# Patient Record
Sex: Male | Born: 1970 | Race: Black or African American | Hispanic: No | Marital: Single | Smoking: Current some day smoker
Health system: Southern US, Community
[De-identification: ages and names within clinical notes are randomized; demographics above are authoritative.]

## PROBLEM LIST (undated history)

## (undated) DIAGNOSIS — B20 Human immunodeficiency virus [HIV] disease: Secondary | ICD-10-CM

## (undated) DIAGNOSIS — Z21 Asymptomatic human immunodeficiency virus [HIV] infection status: Secondary | ICD-10-CM

## (undated) DIAGNOSIS — K219 Gastro-esophageal reflux disease without esophagitis: Secondary | ICD-10-CM

## (undated) HISTORY — DX: Gastro-esophageal reflux disease without esophagitis: K21.9

## (undated) HISTORY — PX: HERNIA REPAIR: SHX51

---

## 1998-06-26 ENCOUNTER — Emergency Department (HOSPITAL_COMMUNITY): Admission: EM | Admit: 1998-06-26 | Discharge: 1998-06-26 | Payer: Self-pay | Admitting: Emergency Medicine

## 2001-02-25 ENCOUNTER — Encounter (INDEPENDENT_AMBULATORY_CARE_PROVIDER_SITE_OTHER): Payer: Self-pay | Admitting: *Deleted

## 2001-02-25 LAB — CONVERTED CEMR LAB: CD4 Count: 192 microliters

## 2001-04-02 ENCOUNTER — Ambulatory Visit (HOSPITAL_COMMUNITY): Admission: RE | Admit: 2001-04-02 | Discharge: 2001-04-02 | Payer: Self-pay | Admitting: Infectious Diseases

## 2001-04-02 ENCOUNTER — Encounter: Admission: RE | Admit: 2001-04-02 | Discharge: 2001-04-02 | Payer: Self-pay | Admitting: Infectious Diseases

## 2001-04-22 ENCOUNTER — Ambulatory Visit (HOSPITAL_COMMUNITY): Admission: RE | Admit: 2001-04-22 | Discharge: 2001-04-22 | Payer: Self-pay | Admitting: Infectious Diseases

## 2001-04-22 ENCOUNTER — Encounter: Admission: RE | Admit: 2001-04-22 | Discharge: 2001-04-22 | Payer: Self-pay | Admitting: Infectious Diseases

## 2001-05-06 ENCOUNTER — Encounter: Admission: RE | Admit: 2001-05-06 | Discharge: 2001-05-06 | Payer: Self-pay | Admitting: Infectious Diseases

## 2001-06-17 ENCOUNTER — Encounter: Admission: RE | Admit: 2001-06-17 | Discharge: 2001-06-17 | Payer: Self-pay | Admitting: Infectious Diseases

## 2001-06-17 ENCOUNTER — Ambulatory Visit (HOSPITAL_COMMUNITY): Admission: RE | Admit: 2001-06-17 | Discharge: 2001-06-17 | Payer: Self-pay | Admitting: Infectious Diseases

## 2001-07-01 ENCOUNTER — Encounter: Admission: RE | Admit: 2001-07-01 | Discharge: 2001-07-01 | Payer: Self-pay | Admitting: Infectious Diseases

## 2002-01-27 ENCOUNTER — Encounter: Admission: RE | Admit: 2002-01-27 | Discharge: 2002-01-27 | Payer: Self-pay | Admitting: Infectious Diseases

## 2002-01-27 ENCOUNTER — Ambulatory Visit (HOSPITAL_COMMUNITY): Admission: RE | Admit: 2002-01-27 | Discharge: 2002-01-27 | Payer: Self-pay | Admitting: Infectious Diseases

## 2002-12-06 ENCOUNTER — Emergency Department (HOSPITAL_COMMUNITY): Admission: EM | Admit: 2002-12-06 | Discharge: 2002-12-06 | Payer: Self-pay | Admitting: Emergency Medicine

## 2003-03-15 ENCOUNTER — Encounter: Payer: Self-pay | Admitting: Infectious Diseases

## 2003-03-15 ENCOUNTER — Encounter: Admission: RE | Admit: 2003-03-15 | Discharge: 2003-03-15 | Payer: Self-pay | Admitting: Infectious Diseases

## 2003-03-15 ENCOUNTER — Encounter (INDEPENDENT_AMBULATORY_CARE_PROVIDER_SITE_OTHER): Payer: Self-pay | Admitting: *Deleted

## 2003-03-15 LAB — CONVERTED CEMR LAB: CD4 Count: 260 microliters

## 2006-08-25 ENCOUNTER — Ambulatory Visit: Payer: Self-pay | Admitting: Infectious Diseases

## 2006-08-25 ENCOUNTER — Encounter (INDEPENDENT_AMBULATORY_CARE_PROVIDER_SITE_OTHER): Payer: Self-pay | Admitting: *Deleted

## 2006-08-25 ENCOUNTER — Encounter: Admission: RE | Admit: 2006-08-25 | Discharge: 2006-08-25 | Payer: Self-pay | Admitting: Infectious Diseases

## 2006-08-25 LAB — CONVERTED CEMR LAB
CD4 Count: 160 microliters
HIV 1 RNA Quant: 41100 copies/mL

## 2006-09-08 ENCOUNTER — Ambulatory Visit: Payer: Self-pay | Admitting: Infectious Diseases

## 2006-10-08 ENCOUNTER — Encounter: Admission: RE | Admit: 2006-10-08 | Discharge: 2006-10-08 | Payer: Self-pay | Admitting: Infectious Diseases

## 2006-10-08 ENCOUNTER — Ambulatory Visit: Payer: Self-pay | Admitting: Infectious Diseases

## 2006-10-08 ENCOUNTER — Encounter (INDEPENDENT_AMBULATORY_CARE_PROVIDER_SITE_OTHER): Payer: Self-pay | Admitting: *Deleted

## 2006-10-08 LAB — CONVERTED CEMR LAB
Alkaline Phosphatase: 70 units/L (ref 39–117)
BUN: 11 mg/dL (ref 6–23)
CO2: 23 meq/L (ref 19–32)
Chloride: 106 meq/L (ref 96–112)
Creatinine, Ser: 1.12 mg/dL (ref 0.40–1.50)
Glucose, Bld: 107 mg/dL — ABNORMAL HIGH (ref 70–99)
MCHC: 34.5 g/dL (ref 33.1–35.4)
MCV: 90.5 fL (ref 78.8–100.0)
Potassium: 4.1 meq/L (ref 3.5–5.3)
RBC: 4.55 M/uL (ref 4.20–5.50)
Total Bilirubin: 0.4 mg/dL (ref 0.3–1.2)
Total Protein: 8.7 g/dL — ABNORMAL HIGH (ref 6.0–8.3)
WBC: 3.5 10*3/uL — ABNORMAL LOW (ref 3.7–10.0)

## 2007-01-01 DIAGNOSIS — B20 Human immunodeficiency virus [HIV] disease: Secondary | ICD-10-CM | POA: Insufficient documentation

## 2007-01-01 DIAGNOSIS — F17200 Nicotine dependence, unspecified, uncomplicated: Secondary | ICD-10-CM

## 2007-01-19 ENCOUNTER — Encounter (INDEPENDENT_AMBULATORY_CARE_PROVIDER_SITE_OTHER): Payer: Self-pay | Admitting: *Deleted

## 2007-01-19 LAB — CONVERTED CEMR LAB

## 2007-01-23 ENCOUNTER — Encounter: Payer: Self-pay | Admitting: Infectious Diseases

## 2007-02-01 ENCOUNTER — Encounter (INDEPENDENT_AMBULATORY_CARE_PROVIDER_SITE_OTHER): Payer: Self-pay | Admitting: *Deleted

## 2007-02-03 ENCOUNTER — Telehealth: Payer: Self-pay | Admitting: Infectious Diseases

## 2007-03-16 ENCOUNTER — Encounter: Admission: RE | Admit: 2007-03-16 | Discharge: 2007-03-16 | Payer: Self-pay | Admitting: Infectious Diseases

## 2007-03-16 ENCOUNTER — Ambulatory Visit: Payer: Self-pay | Admitting: Infectious Diseases

## 2007-04-01 ENCOUNTER — Ambulatory Visit: Payer: Self-pay | Admitting: Infectious Diseases

## 2007-04-01 DIAGNOSIS — R809 Proteinuria, unspecified: Secondary | ICD-10-CM | POA: Insufficient documentation

## 2007-11-24 ENCOUNTER — Telehealth: Payer: Self-pay | Admitting: Infectious Diseases

## 2007-12-29 ENCOUNTER — Encounter: Admission: RE | Admit: 2007-12-29 | Discharge: 2007-12-29 | Payer: Self-pay | Admitting: Infectious Diseases

## 2007-12-29 ENCOUNTER — Ambulatory Visit: Payer: Self-pay | Admitting: Infectious Diseases

## 2007-12-29 LAB — CONVERTED CEMR LAB
ALT: 78 units/L — ABNORMAL HIGH (ref 0–53)
Albumin: 4.5 g/dL (ref 3.5–5.2)
BUN: 14 mg/dL (ref 6–23)
Cholesterol: 114 mg/dL (ref 0–200)
Creatinine, Ser: 1.06 mg/dL (ref 0.40–1.50)
HDL: 32 mg/dL — ABNORMAL LOW (ref >39)
HIV 1 RNA Quant: 158000 copies/mL — ABNORMAL HIGH (ref <50)
Hemoglobin: 13.9 g/dL (ref 13.0–17.0)
Hep A Total Ab: NEGATIVE
LDL Cholesterol: 68 mg/dL (ref 0–99)
MCHC: 33 g/dL (ref 30.0–36.0)
MCV: 94 fL (ref 78.0–100.0)
Potassium: 4.1 meq/L (ref 3.5–5.3)
RBC: 4.48 M/uL (ref 4.22–5.81)
Total Bilirubin: 1 mg/dL (ref 0.3–1.2)
Total CHOL/HDL Ratio: 3.6
Triglycerides: 70 mg/dL (ref <150)
VLDL: 14 mg/dL (ref 0–40)
WBC: 2.5 10*3/uL — ABNORMAL LOW (ref 4.0–10.5)

## 2008-01-07 ENCOUNTER — Encounter (INDEPENDENT_AMBULATORY_CARE_PROVIDER_SITE_OTHER): Payer: Self-pay | Admitting: *Deleted

## 2008-01-11 ENCOUNTER — Ambulatory Visit: Payer: Self-pay | Admitting: Infectious Diseases

## 2009-10-24 ENCOUNTER — Telehealth (INDEPENDENT_AMBULATORY_CARE_PROVIDER_SITE_OTHER): Payer: Self-pay | Admitting: *Deleted

## 2009-12-26 ENCOUNTER — Telehealth: Payer: Self-pay

## 2009-12-29 ENCOUNTER — Ambulatory Visit: Payer: Self-pay | Admitting: Internal Medicine

## 2009-12-29 DIAGNOSIS — K5900 Constipation, unspecified: Secondary | ICD-10-CM | POA: Insufficient documentation

## 2009-12-29 DIAGNOSIS — R1110 Vomiting, unspecified: Secondary | ICD-10-CM

## 2009-12-29 DIAGNOSIS — K219 Gastro-esophageal reflux disease without esophagitis: Secondary | ICD-10-CM | POA: Insufficient documentation

## 2009-12-29 LAB — CONVERTED CEMR LAB
AST: 30 units/L (ref 0–37)
Albumin: 4.6 g/dL (ref 3.5–5.2)
CO2: 25 meq/L (ref 19–32)
Calcium: 9.5 mg/dL (ref 8.4–10.5)
Chloride: 106 meq/L (ref 96–112)
Eosinophils Relative: 7 % — ABNORMAL HIGH (ref 0–5)
Glucose, Bld: 98 mg/dL (ref 70–99)
HIV 1 RNA Quant: 51 copies/mL — ABNORMAL HIGH (ref <48)
Hemoglobin: 15.3 g/dL (ref 13.0–17.0)
Lymphocytes Relative: 31 % (ref 12–46)
MCHC: 34.3 g/dL (ref 30.0–36.0)
Neutro Abs: 2.2 10*3/uL (ref 1.7–7.7)
Neutrophils Relative %: 50 % (ref 43–77)
Platelets: 226 10*3/uL (ref 150–400)
Potassium: 4 meq/L (ref 3.5–5.3)
RDW: 12.1 % (ref 11.5–15.5)
Sodium: 140 meq/L (ref 135–145)
Total Bilirubin: 0.4 mg/dL (ref 0.3–1.2)
WBC: 4.5 10*3/uL (ref 4.0–10.5)

## 2010-01-05 ENCOUNTER — Ambulatory Visit (HOSPITAL_COMMUNITY): Admission: RE | Admit: 2010-01-05 | Discharge: 2010-01-05 | Payer: Self-pay | Admitting: Internal Medicine

## 2010-01-05 ENCOUNTER — Telehealth: Payer: Self-pay | Admitting: Infectious Diseases

## 2010-01-23 ENCOUNTER — Ambulatory Visit: Payer: Self-pay | Admitting: Infectious Diseases

## 2010-01-23 DIAGNOSIS — L0293 Carbuncle, unspecified: Secondary | ICD-10-CM

## 2010-01-23 DIAGNOSIS — L0292 Furuncle, unspecified: Secondary | ICD-10-CM

## 2010-01-31 ENCOUNTER — Emergency Department (HOSPITAL_COMMUNITY): Admission: EM | Admit: 2010-01-31 | Discharge: 2010-02-01 | Payer: Self-pay | Admitting: Emergency Medicine

## 2010-02-01 ENCOUNTER — Telehealth (INDEPENDENT_AMBULATORY_CARE_PROVIDER_SITE_OTHER): Payer: Self-pay | Admitting: *Deleted

## 2010-02-02 ENCOUNTER — Ambulatory Visit: Payer: Self-pay | Admitting: Internal Medicine

## 2010-02-28 ENCOUNTER — Telehealth: Payer: Self-pay | Admitting: Infectious Diseases

## 2010-12-23 LAB — CONVERTED CEMR LAB
Alkaline Phosphatase: 102 units/L (ref 39–117)
BUN: 12 mg/dL (ref 6–23)
Basophils Absolute: 0 10*3/uL (ref 0.0–0.1)
CD4 Count: 200 microliters
Chloride: 110 meq/L (ref 96–112)
Creatinine, Ser: 1.18 mg/dL (ref 0.40–1.50)
Eosinophils Absolute: 0.2 10*3/uL (ref 0.0–0.7)
Glucose, Bld: 110 mg/dL — ABNORMAL HIGH (ref 70–99)
HIV 1 RNA Quant: 241 copies/mL — ABNORMAL HIGH (ref <50)
Lymphocytes Relative: 34 % (ref 12–46)
MCHC: 32.4 g/dL (ref 30.0–36.0)
MCV: 98.9 fL (ref 78.0–100.0)
Neutro Abs: 1.8 10*3/uL (ref 1.7–7.7)
Neutrophils Relative %: 47 % (ref 43–77)
Potassium: 4.3 meq/L (ref 3.5–5.3)
RDW: 11.9 % (ref 11.5–14.0)
Sodium: 141 meq/L (ref 135–145)
Total Bilirubin: 0.5 mg/dL (ref 0.3–1.2)

## 2010-12-27 NOTE — Assessment & Plan Note (Signed)
Summary: Elijah Guerra   CC:  follow-up visit / knuckles on both hands have been hurting.  History of Present Illness: 40 yo  40 yo m with HIV+ diagnosed 2002  and was lost to f/u 2003 to 2007. He was started on atripla 10-07.   Had CD4 310 and VL 51 (12-29-2009). Has been doing well. needs refills on meds. No missed meds. Seen by MD last month for n/v. Had nl upper gi, started on PPI. Has improved since.   Preventive Screening-Counseling & Management  Alcohol-Tobacco     Alcohol drinks/day: 0     Smoking Status: current     Smoking Cessation Counseling: yes     Cigars/week: 4 per week  Caffeine-Diet-Exercise     Caffeine use/day: tea, coffee occassionally     Does Patient Exercise: yes     Type of exercise: gym membership     Exercise (avg: min/session): >60     Times/week: 3  Safety-Violence-Falls     Seat Belt Use: yes   Updated Prior Medication List: ATRIPLA 600-200-300 MG TABS (EFAVIRENZ-EMTRICITAB-TENOFOVIR) Take 1 tablet by mouth at bedtime PRILOSEC 20 MG CPDR (OMEPRAZOLE) Take 1 tablet by mouth once a day  Current Allergies (reviewed today): No known allergies  Past History:  Past medical, surgical, family and social histories (including risk factors) reviewed, and no changes noted (except as noted below).  Past Medical History: HIV disease Cigarette smoking GERD  Current Medications (verified): 1)  Atripla 600-200-300 Mg Tabs (Efavirenz-Emtricitab-Tenofovir) .... Take 1 Tablet By Mouth At Bedtime 2)  Prilosec 20 Mg Cpdr (Omeprazole) .... Take 1 Tablet By Mouth Once A Day  Allergies (verified): No Known Drug Allergies   Family History: Reviewed history and no changes required.  Social History: Reviewed history and no changes required.  Review of Systems       wt steady.   Vital Signs:  Patient profile:   40 year old male Height:      66 inches (167.64 cm) Weight:      178.6 pounds (81.18 kg) BMI:     28.93 Temp:     97.9 degrees F (36.61 degrees C)  oral Pulse rate:   92 / minute BP sitting:   112 / 71  (right arm)  Vitals Entered By: Baxter Hire) (January 23, 2010 3:27 PM) CC: follow-up visit / knuckles on both hands have been hurting Pain Assessment Patient in pain? yes     Location: knuckles Intensity: 4 Type: aching Onset of pain  pain happens when overuse of hands Nutritional Status BMI of 25 - 29 = overweight Nutritional Status Detail appetite is fine per patient  Does patient need assistance? Functional Status Self care Ambulation Normal        Medication Adherence: 01/23/2010   Adherence to medications reviewed with patient. Counseling to provide adequate adherence provided   Prevention For Positives: 01/23/2010   Safe sex practices discussed with patient. Condoms offered.                             Physical Exam  General:  well-developed, well-nourished, and well-hydrated.   Eyes:  pupils equal, pupils round, and pupils reactive to light.   Mouth:  pharynx pink and moist and no exudates.   Neck:  no masses.   Lungs:  normal respiratory effort and normal breath sounds.   Heart:  normal rate, regular rhythm, and no murmur.   Abdomen:  soft, non-tender, and  normal bowel sounds.   Skin:  healing furuncle on R mid/lower abd. nonflucuant. non-tender. no increase heat, no erythema.    Impression & Recommendations:  Problem # 1:  HIV DISEASE (ICD-042) he is doing well, offered condoms. taking meds well. return to clinic 6 months with labs prior His updated medication list for this problem includes:    Bactrim Ds 800-160 Mg Tabs (Sulfamethoxazole-trimethoprim) .Marland Kitchen... Take 1 tablet by mouth two times a day  Problem # 2:  GERD (ICD-530.81) improved. cont ppi.  His updated medication list for this problem includes:    Prilosec 20 Mg Cpdr (Omeprazole) .Marland Kitchen... Take 1 tablet by mouth once a day  Problem # 3:  BOILS, RECURRENT (ICD-680.9) suspect he has MRSA skin infections. will give him trial of  bactrim to see if we can clear this up.   Medications Added to Medication List This Visit: 1)  Bactrim Ds 800-160 Mg Tabs (Sulfamethoxazole-trimethoprim) .... Take 1 tablet by mouth two times a day  Other Orders: Est. Patient Level IV ([address]) Future Orders: T-CD4SP ([facility]) (CD4SP) ... 07/22/2010 T-HIV Viral Load [id number]) ... 07/22/2010 T-Comprehensive Metabolic Panel [phone]) ... 07/22/2010 T-CBC w/Diff ([id number]) ... 07/22/2010 T-Lipid Profile [id number]) ... 07/22/2010 T-RPR (Syphilis) [id number]) ... 07/22/2010  Prescriptions: PRILOSEC 20 MG CPDR (OMEPRAZOLE) Take 1 tablet by mouth once a day  #90 x 3   Entered and Authorized by:   Johny Sax MD   Signed by:   Johny Sax MD on 01/23/2010   Method used:   Electronically to        [facility] [address] #[id number]* (retail)       [address] [place], [place]  [address]       Ph: [phone]       Fax: [phone]   RxID:   [id number] ATRIPLA 600-200-300 MG TABS (EFAVIRENZ-EMTRICITAB-TENOFOVIR) Take 1 tablet by mouth at bedtime  #90 x 3   Entered and Authorized by:   Johny Sax MD   Signed by:   Johny Sax MD on 01/23/2010   Method used:   Electronically to        [facility] [address] #[id number]* (retail)       [address] [place], [place]  [address]       Ph: [phone]       Fax: [phone]   RxID:   [id number] BACTRIM DS 800-160 MG TABS (SULFAMETHOXAZOLE-TRIMETHOPRIM) Take 1 tablet by mouth two times a day  #28 x 1   Entered and Authorized by:   Johny Sax MD   Signed by:   Johny Sax MD on 01/23/2010   Method used:   Electronically to        [facility] [address] #[id number]* (retail)       [address] [place], [place]  [address]       Ph: [phone]       Fax: [phone]   RxID:   [id number]  Process Orders Check Orders Results:     [facility]: ABN not required for this insurance Tests Sent for requisitioning  (January 23, 2010 4:11 PM):     07/22/2010: [facility] -- T-HIV Viral Load [id number] (signed)     07/22/2010: [facility] -- T-Comprehensive Metabolic Panel [[id number]] (signed)     07/22/2010: [facility] -- T-CBC w/Diff [[id number]] (signed)     07/22/2010: [facility] -- T-Lipid Profile [phone] (signed)     07/22/2010: [facility] -- T-RPR (Syphilis) [phone] (signed)

## 2010-12-27 NOTE — Progress Notes (Signed)
Summary: rx for blisters not working  Phone Note Call from Patient Call back at [facility] [phone]   Caller: Patient Reason for Call: Acute Illness Action Taken: Phone Call Completed, Appt Scheduled Summary of Call: pt c/o bad outbreak of blisters on testicles.  Was given medication by Dr. Daiva Eves, but this is not helping and outbreak has gotten worse since last visit.requesting appt. Wendall Mola CMA Duncan Dull)  February 01, 2010 11:24 AM

## 2010-12-27 NOTE — Assessment & Plan Note (Signed)
Summary: EST/CK/CFB   Chief Complaint:  f/u HIV disease.  History of Present Illness: 40 yo m with HIV+ diagnosed 2002  and was lost to f/u 2003 to 2007. He was started on atripla 10-07.  Most recent CD4 160 and VL 158k (12-29-07).  taknig meds, was off in dec. rx ran out. restarted 3 weeks.  eating well, wt up, moving bowels well,                Updated Prior Medication List: ATRIPLA 600-200-300 MG TABS (EFAVIRENZ-EMTRICITAB-TENOFOVIR) Take 1 tablet by mouth at bedtime  Current Allergies (reviewed today): No known allergies     Risk Factors:  Tobacco use:  current    Cigarettes:  Yes -- 1ppd pack(s) per day    Counseled to quit/cut down tobacco use:  yes    Vital Signs:  Patient Profile:   40 Years Old Male Height:     66 inches (167.64 cm) Weight:      183.4 pounds (83.36 kg) BMI:     29.71 Temp:     97.2 degrees F (36.22 degrees C) oral Pulse rate:   75 / minute BP sitting:   112 / 76  (left arm)  Pt. in pain?   no  Vitals Entered By: Krystal Eaton Duncan Dull) (January 11, 2008 10:52 AM)              Is Patient Diabetic? No Nutritional Status BMI of 25 - 29 = overweight  Have you ever been in a relationship where you felt threatened, hurt or afraid?No   Does patient need assistance? Functional Status Self care Ambulation Normal     Physical Exam  General:     well-developed, well-nourished, and well-hydrated.   Eyes:     pupils equal, pupils round, and pupils reactive to light.   Mouth:     pharynx pink and moist and no exudates.   Neck:     no masses.   Lungs:     normal respiratory effort, no intercostal retractions, and no accessory muscle use.   Heart:     normal rate, regular rhythm, and no murmur.   Abdomen:     soft, non-tender, and normal bowel sounds.      Impression & Recommendations:  Problem # 1:  HIV DISEASE (ICD-042) hard to interpret  his labs in light of his recent time off medicines. I reiterated to him the need to  stay on his medicines and risk of developing resistance.  he is offered condoms will see him bac in 3 months when has been on medicines and recheck his CD4, Vl, genotype, hep A antibody.  His updated medication list for this problem includes:    Atripla 600-200-300 Mg Tabs (Efavirenz-emtricitab-tenofovir) .Marland Kitchen... Take 1 tablet by mouth at bedtime  Orders: Influenza Vaccine NON MCR (84132) Hepatitis B Vaccine >30yrs (44010) Admin 1st Vaccine (27253) Est. Patient Level III (66440)      ]     Hepatitis B Vaccine # 3    Vaccine Type: HepB Adult    Site: left deltoid    Mfr: GlaxoSmithKline    Dose: 1.0 ml    Route: IM    Given by: Krystal Eaton (AAMA)    Exp. Date: 09/30/2009    Lot #: [id number]    VIS given: 06/04/00 version given January 11, 2008.  Influenza Vaccine    Vaccine Type: Fluvax Non-MCR    Site: right deltoid    Mfr: Sanofi Pasteur    Dose: 0.5  ml    Route: IM    Given by: Krystal Eaton (AAMA)    Exp. Date: 05/24/2008    Lot #: [id number]    VIS given: 06/18/07 version given January 11, 2008.  Flu Vaccine Consent Questions    Do you have a history of severe allergic reactions to this vaccine? no    Any prior history of allergic reactions to egg and/or gelatin? no    Do you have a sensitivity to the preservative Thimersol? no    Do you have a past history of Guillan-Barre Syndrome? no    Do you currently have an acute febrile illness? no    Have you ever had a severe reaction to latex? no    Vaccine information given and explained to patient? yes

## 2010-12-27 NOTE — Assessment & Plan Note (Signed)
Summary: blisters/jc   CC:  blisters on testicles x 2 weeks prescribed bactrim and not seeming to work.  History of Present Illness: Pt states that the boils on his scrotum became larger and more painful so he went to the ED. They opened one of the areas and drained some pus and switched hm to Doxycycline and gave him some percocet.  he is still in pain but the swelling has gone down.  He is concerned that it is still draining.  Preventive Screening-Counseling & Management  Alcohol-Tobacco     Alcohol drinks/day: 0     Smoking Status: current     Smoking Cessation Counseling: yes     Packs/Day: <0.25     Cigars/week: 4 per week  Caffeine-Diet-Exercise     Caffeine use/day: tea, coffee occassionally     Does Patient Exercise: yes     Type of exercise: gym membership     Exercise (avg: min/session): >60     Times/week: 3   Updated Prior Medication List: ATRIPLA 600-200-300 MG TABS (EFAVIRENZ-EMTRICITAB-TENOFOVIR) Take 1 tablet by mouth at bedtime PRILOSEC 20 MG CPDR (OMEPRAZOLE) Take 1 tablet by mouth once a day BACTRIM DS 800-160 MG TABS (SULFAMETHOXAZOLE-TRIMETHOPRIM) Take 1 tablet by mouth two times a day DOXYCYCLINE HYCLATE 100 MG CAPS (DOXYCYCLINE HYCLATE) take one capsule by mouth twice daily x 14 days PERCOCET 10-325 MG TABS (OXYCODONE-ACETAMINOPHEN) Take 1 tablet by mouth every 6 hours as needed  Current Allergies (reviewed today): No known allergies  Past History:  Past Medical History: Last updated: 01/23/2010 HIV disease Cigarette smoking GERD  Review of Systems  The patient denies fever and abdominal pain.    Vital Signs:  Patient profile:   40 year old male Height:      66 inches (167.64 cm) Weight:      176.8 pounds (80.36 kg) BMI:     28.64 Temp:     98.0 degrees F (36.67 degrees C) oral Pulse rate:   83 / minute BP sitting:   108 / 68  (left arm)  Vitals Entered By: Wendall Mola CMA Duncan Dull) (February 02, 2010 10:12 AM) CC: blisters on  testicles x 2 weeks prescribed bactrim and not seeming to work Pain Assessment Patient in pain? yes     Location: groin Intensity: 7 Type: stinging and pressure Nutritional Status BMI of 25 - 29 = overweight Nutritional Status Detail appetite "good"  Does patient need assistance? Functional Status Self care Ambulation Normal Comments no missed doses of meds per patient   Physical Exam  General:  alert, well-developed, well-nourished, and well-hydrated.   Head:  normocephalic and atraumatic.   Genitalia:  several indurated areas on his scrotum  incision with some drainage in one area   Impression & Recommendations:  Problem # 1:  BOILS, RECURRENT (ICD-680.9) S/P I&D  Pt to continue Doxy and soak in warm baths.Percocet for pain. If areas enlarge again pt to call and we will refer him to surgeon Orders: Est. Patient Level III ([address]109)  Problem # 2:  HIV DISEASE (ICD-042) Pt to f/u as scheduled. Atripla Rx refilled. Diagnostics Reviewed:  HIV: CDC-defined AIDS (01/23/2010)   CD4: 310 (01/01/2010)   WBC: 4.5 (12/29/2009)   Hgb: 15.3 (12/29/2009)   HCT: 44.6 (12/29/2009)   Platelets: 226 (12/29/2009) HIV-1 RNA: 51 (12/29/2009)   HBSAg: No (01/19/2007)  Medications Added to Medication List This Visit: 1)  Doxycycline Hyclate 100 Mg Caps (Doxycycline hyclate) .... Take one capsule by mouth twice daily x 14 days  2)  Percocet 10-325 Mg Tabs (Oxycodone-acetaminophen) .... Take 1 tablet by mouth every 6 hours as needed Prescriptions: PERCOCET 10-325 MG TABS (OXYCODONE-ACETAMINOPHEN) Take 1 tablet by mouth every 6 hours as needed  #30 x 0   Entered and Authorized by:   Yisroel Ramming MD   Signed by:   Yisroel Ramming MD on 02/02/2010   Method used:   Print then Give to Patient   RxID:   [id number] ATRIPLA 600-200-300 MG TABS (EFAVIRENZ-EMTRICITAB-TENOFOVIR) Take 1 tablet by mouth at bedtime  #90 x 3   Entered and Authorized by:   Yisroel Ramming MD   Signed by:   Yisroel Ramming  MD on 02/02/2010   Method used:   Print then Give to Patient   RxID:   [id number]

## 2010-12-27 NOTE — Progress Notes (Signed)
Summary: boils on testicles returned  Phone Note Call from Patient   Summary of Call: boils have returned on his testicles.  Pt is requesting a refill of the doxycycline.   Tomasita Morrow RN  February 28, 2010 10:25 AM   Follow-up for Phone Call        ok for refill per Dr Ninetta Lights     Prescriptions: DOXYCYCLINE HYCLATE 100 MG CAPS (DOXYCYCLINE HYCLATE) take one capsule by mouth twice daily x 14 days  #28 x 1   Entered by:   Tomasita Morrow RN   Authorized by:   Johny Sax MD   Signed by:   Tomasita Morrow RN on 02/28/2010   Method used:   Electronically to        [facility] #[id number]* (retail)       [address] [place], [place]  [address]       Ph: [phone]       Fax: [phone]   RxID:   [id number]

## 2010-12-27 NOTE — Assessment & Plan Note (Signed)
Summary: abdominal bloating/constipation/tkk   CC:  abdominal pain, bloating, vomiting, and infrequent bowel movements.  History of Present Illness: Pt c/o several weeks of worsening acid reflux to the point he is vomiting at night.  It does not matter what he eats. He also c/o bloating after eating and constipation.  No abdominal pain, no difficulty swallowing and no nausea.  Appetite is good.  No change in diet or medications. No fever or chills. He has been taking tums without much relief. He states that he has been taking his Atripla.   Flu Vaccine Consent Questions     Do you have a history of severe allergic reactions to this vaccine? no    Any prior history of allergic reactions to egg and/or gelatin? no    Do you have a sensitivity to the preservative Thimersol? no    Do you have a past history of Guillan-Barre Syndrome? no    Do you currently have an acute febrile illness? no    Have you ever had a severe reaction to latex? no    Vaccine information given and explained to patient? yes    Are you currently pregnant? no    Lot [id number]   Exp Date:02/22/2010   Manufacturer: [employer]    Site Given  Left Deltoid IM Starleen Arms CMA  December 29, 2009 11:09 AM   Preventive Screening-Counseling & Management  Alcohol-Tobacco     Alcohol drinks/day: 0     Smoking Status: current     Cigars/week: 2 per day  Caffeine-Diet-Exercise     Caffeine use/day: 1 soda daily      Drug Use:  none.        Blood Transfusions:  no.        Travel History:  none.     Updated Prior Medication List: ATRIPLA 600-200-300 MG TABS (EFAVIRENZ-EMTRICITAB-TENOFOVIR) Take 1 tablet by mouth at bedtime PRILOSEC 20 MG CPDR (OMEPRAZOLE) Take 1 tablet by mouth once a day  Current Allergies (reviewed today): No known allergies  Past History:  Past Medical History: Last updated: 01/01/2007 HIV disease Cigarette smoking  Social History: Drug Use:  none Blood Transfusions:   no Travel History:  none  Review of Systems       The patient complains of severe indigestion/heartburn.  The patient denies anorexia, fever, weight loss, melena, and hematochezia.    Vital Signs:  Patient profile:   40 year old male Height:      66 inches (167.64 cm) Weight:      176 pounds (80 kg) BMI:     28.51 Temp:     97.7 degrees F (36.50 degrees C) oral Pulse rate:   81 / minute BP sitting:   106 / 71  (left arm)  Vitals Entered By: Starleen Arms CMA (December 29, 2009 10:08 AM) CC: abdominal pain, bloating, vomiting, infrequent bowel movements Is Patient Diabetic? No Pain Assessment Patient in pain? no      Nutritional Status BMI of 25 - 29 = overweight  Does patient need assistance? Functional Status Self care Ambulation Normal   Physical Exam  General:  alert, well-developed, well-nourished, and well-hydrated.   Head:  normocephalic and atraumatic.   Mouth:  pharynx pink and moist.  no thrush  Abdomen:  soft, non-tender, normal bowel sounds, no distention, no masses, no guarding, no rigidity, and no rebound tenderness.     Impression & Recommendations:  Problem # 1:  VOMITING (ICD-787.03) Associated with gerd.  will obtain an UGI  treat with prilosec may need EGD Orders: Est. Patient Level III ([address]109) GI (GI)  Problem # 2:  CONSTIPATION (ICD-564.00) increase fluids check TSH Orders: T-TSH ([id number])  Problem # 3:  HIV DISEASE (ICD-042) Will obtain labs today and have pt f/u with Dr. Ninetta Lights Orders: Est. Patient Level III ([address]) T-CBC w/Diff [phone]) T-CD4SP [facility]) (CD4SP) T-Comprehensive Metabolic Panel [phone]) T-HIV Viral Load [phone])  Diagnostics Reviewed:  CD4: 160 (12/30/2007)   WBC: 2.5 (12/29/2007)   Hgb: 13.9 (12/29/2007)   HCT: 42.1 (12/29/2007)   Platelets: 158 (12/29/2007) HIV-1 RNA: 158000 (12/29/2007)   HBSAg: No (01/19/2007)  Medications Added to Medication List This Visit: 1)  Prilosec 20 Mg  Cpdr (Omeprazole) .... Take 1 tablet by mouth once a day  Other Orders: Admin 1st Vaccine (28413) Flu Vaccine 67yrs + (24401)  Patient Instructions: 1)  will call with reuslts  Prescriptions: PRILOSEC 20 MG CPDR (OMEPRAZOLE) Take 1 tablet by mouth once a day  #30 x 5   Entered and Authorized by:   Yisroel Ramming MD   Signed by:   Yisroel Ramming MD on 12/29/2009   Method used:   Print then Give to Patient   RxID:   [id number]  Process Orders Check Orders Results:     [facility]: ABN not required for this insurance Tests Sent for requisitioning (December 29, 2009 3:15 PM):     12/29/2009: [facility] -- T-TSH [phone] (signed)     12/29/2009: [facility] -- T-CBC w/Diff [[id number]] (signed)     12/29/2009: [facility] -- T-Comprehensive Metabolic Panel [[id number]] (signed)     12/29/2009: [facility] -- T-HIV Viral Load [phone] (signed)

## 2010-12-27 NOTE — Progress Notes (Signed)
Summary: patient results/TY  Phone Note Outgoing Call   Call placed by: Starleen Arms CMA,  January 05, 2010 4:13 PM Summary of Call: Called patient to tell him results are nl. Initial call taken by: Starleen Arms CMA,  January 05, 2010 4:14 PM

## 2010-12-27 NOTE — Progress Notes (Signed)
Summary: Questions about BM  Phone Note Call from Patient   Summary of Call: Pt states he is having problems with his stools.  Decreased  bowel movement   His  normal is twice a a day and he had gone two days w/o a BM prior to taking Colace.  He still feels bloated and is having increased problems with reflux.  He has requested OV.   OV given with Dr Philipp Deputy for 11-28-08.  Tomasita Morrow RN  December 26, 2009 12:07 PM  Initial call taken by: Tomasita Morrow RN,  December 26, 2009 12:07 PM

## 2011-02-09 IMAGING — CR DG UGI W/ KUB
1 series · 1 of 1 positions shown · non-contrast
Comparison: None.

CLINICAL DATA: Vomiting, bloating and constipation.

UPPER GI SERIES WITH KUB
TECHNIQUE: Routine upper GI series was performed with pain and
high density barium.
Fluoroscopy Time: 3.7 minutes

[view not recorded]
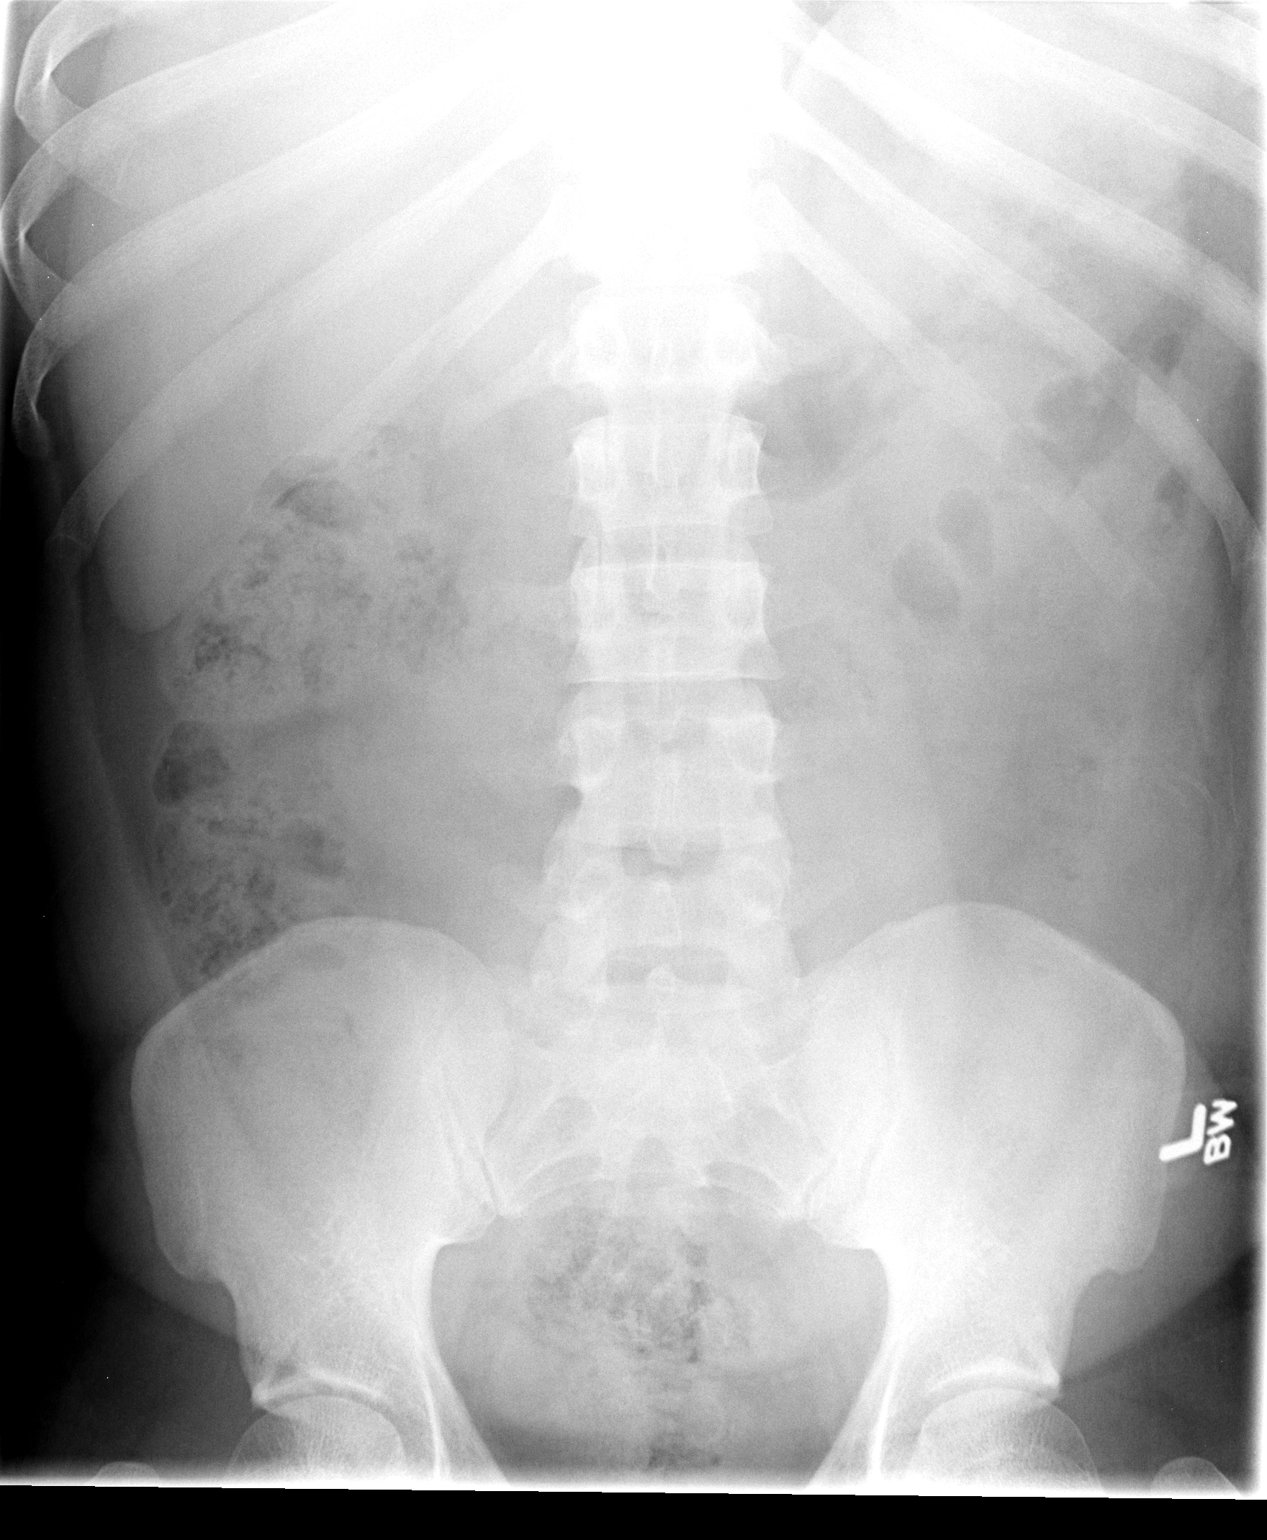

[1 of 1 positions shown; findings below may reference images not displayed]

FINDINGS: Scout film demonstrates normal bowel gas pattern.  No
focal bony abnormality.  No unexpected calcification.

Double contrast upper GI series was performed.  The esophagus
demonstrates normal contour and appearance without stricture or
mass lesion.  Esophageal motility is normal.  No hiatal hernia is
present.  No gastroesophageal reflux was elicited.  The stomach and
duodenal bulb and sweep also all appear normal without stricture,
mass or evidence inflammatory process.
IMPRESSION: Normal upper GI series.

## 2011-02-13 LAB — T-HELPER CELL (CD4) - (RCID CLINIC ONLY)
CD4 % Helper T Cell: 23 % — ABNORMAL LOW (ref 33–55)
CD4 T Cell Abs: 310 uL — ABNORMAL LOW (ref 400–2700)

## 2011-02-22 ENCOUNTER — Other Ambulatory Visit (INDEPENDENT_AMBULATORY_CARE_PROVIDER_SITE_OTHER): Payer: Managed Care, Other (non HMO)

## 2011-02-22 ENCOUNTER — Other Ambulatory Visit: Payer: Self-pay | Admitting: Infectious Diseases

## 2011-02-22 DIAGNOSIS — B20 Human immunodeficiency virus [HIV] disease: Secondary | ICD-10-CM

## 2011-02-22 LAB — T-HELPER CELL (CD4) - (RCID CLINIC ONLY)
CD4 % Helper T Cell: 24 % — ABNORMAL LOW (ref 33–55)
CD4 T Cell Abs: 480 uL (ref 400–2700)

## 2011-02-23 LAB — COMPLETE METABOLIC PANEL WITH GFR
ALT: 42 U/L (ref 0–53)
AST: 28 U/L (ref 0–37)
CO2: 21 mEq/L (ref 19–32)
Calcium: 8.9 mg/dL (ref 8.4–10.5)
Creat: 0.99 mg/dL (ref 0.40–1.50)
GFR, Est African American: >60 mL/min (ref >60)
Glucose, Bld: 108 mg/dL — ABNORMAL HIGH (ref 70–99)
Sodium: 139 mEq/L (ref 135–145)
Total Protein: 6.8 g/dL (ref 6.0–8.3)

## 2011-02-23 LAB — CBC WITH DIFFERENTIAL/PLATELET
Basophils Absolute: 0 10*3/uL (ref 0.0–0.1)
Eosinophils Absolute: 0.4 10*3/uL (ref 0.0–0.7)
Eosinophils Relative: 6 % — ABNORMAL HIGH (ref 0–5)
HCT: 42.6 % (ref 39.0–52.0)
MCHC: 32.9 g/dL (ref 30.0–36.0)
MCV: 100.7 fL — ABNORMAL HIGH (ref 78.0–100.0)
Neutro Abs: 2.9 10*3/uL (ref 1.7–7.7)
Platelets: 241 10*3/uL (ref 150–400)
RDW: 12.5 % (ref 11.5–15.5)
WBC: 5.7 10*3/uL (ref 4.0–10.5)

## 2011-02-23 LAB — LIPID PANEL
Cholesterol: 134 mg/dL (ref 0–200)
Triglycerides: 327 mg/dL — ABNORMAL HIGH (ref <150)

## 2011-02-23 LAB — HIV-1 RNA QUANT-NO REFLEX-BLD: HIV-1 RNA Quant, Log: <1.3 {Log} (ref <1.30)

## 2011-02-23 LAB — TESTOSTERONE: Testosterone: 479.56 ng/dL (ref 250–890)

## 2011-03-06 ENCOUNTER — Encounter: Payer: Self-pay | Admitting: Infectious Diseases

## 2011-03-06 ENCOUNTER — Ambulatory Visit (INDEPENDENT_AMBULATORY_CARE_PROVIDER_SITE_OTHER): Payer: Managed Care, Other (non HMO) | Admitting: Infectious Diseases

## 2011-03-06 VITALS — BP 120/79 | HR 73 | Temp 98.3°F | Ht 66.0 in | Wt 172.4 lb

## 2011-03-06 DIAGNOSIS — B20 Human immunodeficiency virus [HIV] disease: Secondary | ICD-10-CM

## 2011-03-06 DIAGNOSIS — K219 Gastro-esophageal reflux disease without esophagitis: Secondary | ICD-10-CM

## 2011-03-06 DIAGNOSIS — F17200 Nicotine dependence, unspecified, uncomplicated: Secondary | ICD-10-CM

## 2011-03-06 DIAGNOSIS — Z23 Encounter for immunization: Secondary | ICD-10-CM

## 2011-03-06 MED ORDER — OMEPRAZOLE 20 MG PO CPDR
20.0000 mg | DELAYED_RELEASE_CAPSULE | Freq: Two times a day (BID) | ORAL | Status: DC
Start: 2011-03-06 — End: 2012-07-22

## 2011-03-06 NOTE — Assessment & Plan Note (Signed)
He will cont to use OTC prilosec prn

## 2011-03-06 NOTE — Assessment & Plan Note (Signed)
He is doing well, condom use reinforced as is adherence. He is also reminded to wear his seat belt. His rx is refilled. He will rtc in 6 months (hoefully).

## 2011-03-06 NOTE — Assessment & Plan Note (Signed)
He is encouraged to stop smoking. 

## 2011-03-06 NOTE — Progress Notes (Signed)
  Subjective:    Patient ID: Elijah Guerra, male    DOB: 05-25-1971, 40 y.o.   MRN: [id number]  HPI 39 yo M with HIV+, last seen in clinic 01-2010. At that time had testicular boils that were treated with doxy and percocet. His most recent labs are CD 480 and VL <20, Trig 327.  Just turned 40, feels different. C/o peeling of skin on his L foot. Did not get complete relief with spray medicine.  No problems with atripla- 2 doses missed last week.   Review of Systems reflux    Objective:   Physical Exam  Constitutional: He appears well-developed and well-nourished.  Eyes: EOM are normal. Pupils are equal, round, and reactive to light.  Neck: Normal range of motion. Neck supple.  Cardiovascular: Normal rate, regular rhythm and normal heart sounds.   Pulmonary/Chest: Effort normal and breath sounds normal.  Abdominal: Soft. Bowel sounds are normal.  Musculoskeletal:       Feet:          Assessment & Plan:

## 2011-04-05 ENCOUNTER — Emergency Department (HOSPITAL_COMMUNITY)
Admission: EM | Admit: 2011-04-05 | Discharge: 2011-04-06 | Disposition: A | Discharge status: Home / Self Care | Payer: Managed Care, Other (non HMO) | Attending: Emergency Medicine | Admitting: Emergency Medicine

## 2011-04-05 DIAGNOSIS — X58XXXA Exposure to other specified factors, initial encounter: Secondary | ICD-10-CM | POA: Insufficient documentation

## 2011-04-05 DIAGNOSIS — K219 Gastro-esophageal reflux disease without esophagitis: Secondary | ICD-10-CM | POA: Insufficient documentation

## 2011-04-05 DIAGNOSIS — Z21 Asymptomatic human immunodeficiency virus [HIV] infection status: Secondary | ICD-10-CM | POA: Insufficient documentation

## 2011-04-05 DIAGNOSIS — T148XXA Other injury of unspecified body region, initial encounter: Secondary | ICD-10-CM | POA: Insufficient documentation

## 2011-04-05 DIAGNOSIS — M549 Dorsalgia, unspecified: Secondary | ICD-10-CM | POA: Insufficient documentation

## 2011-04-05 DIAGNOSIS — M62838 Other muscle spasm: Secondary | ICD-10-CM | POA: Insufficient documentation

## 2011-04-05 DIAGNOSIS — R079 Chest pain, unspecified: Secondary | ICD-10-CM | POA: Insufficient documentation

## 2011-04-05 LAB — URINALYSIS, ROUTINE W REFLEX MICROSCOPIC
Nitrite: NEGATIVE
Urobilinogen, UA: 1 mg/dL (ref 0.0–1.0)
pH: 5.5 (ref 5.0–8.0)

## 2011-04-06 ENCOUNTER — Emergency Department (HOSPITAL_COMMUNITY): Payer: Managed Care, Other (non HMO)

## 2011-04-06 LAB — D-DIMER, QUANTITATIVE: D-Dimer, Quant: <0.22 ug/mL-FEU (ref 0.00–0.48)

## 2011-10-24 ENCOUNTER — Encounter: Payer: Self-pay | Admitting: *Deleted

## 2011-10-30 ENCOUNTER — Other Ambulatory Visit: Payer: Self-pay | Admitting: Licensed Clinical Social Worker

## 2011-10-30 DIAGNOSIS — A4902 Methicillin resistant Staphylococcus aureus infection, unspecified site: Secondary | ICD-10-CM

## 2011-10-30 MED ORDER — CHLORHEXIDINE GLUCONATE 4 % EX LIQD: 60.0000 mL | Freq: Every day | CUTANEOUS | Status: AC | PRN

## 2011-10-30 MED ORDER — MUPIROCIN 2 % EX OINT: TOPICAL_OINTMENT | Freq: Two times a day (BID) | CUTANEOUS | Status: AC

## 2011-11-21 ENCOUNTER — Telehealth: Payer: Self-pay | Admitting: *Deleted

## 2011-11-21 NOTE — Telephone Encounter (Signed)
Called patient to have him reschedule a follow up appointment his phone does not have a voice mail box set up. Was unable to leave message.

## 2011-12-04 ENCOUNTER — Telehealth: Payer: Self-pay | Admitting: Infectious Disease

## 2011-12-04 NOTE — Telephone Encounter (Signed)
Pt changed phone number wishes to make fu appt

## 2012-01-29 ENCOUNTER — Other Ambulatory Visit: Payer: Self-pay | Admitting: Infectious Diseases

## 2012-01-29 DIAGNOSIS — Z79899 Other long term (current) drug therapy: Secondary | ICD-10-CM

## 2012-01-29 DIAGNOSIS — Z113 Encounter for screening for infections with a predominantly sexual mode of transmission: Secondary | ICD-10-CM

## 2012-01-30 ENCOUNTER — Other Ambulatory Visit: Payer: Managed Care, Other (non HMO)

## 2012-01-30 ENCOUNTER — Ambulatory Visit: Payer: Managed Care, Other (non HMO) | Admitting: Infectious Diseases

## 2012-01-30 DIAGNOSIS — Z79899 Other long term (current) drug therapy: Secondary | ICD-10-CM

## 2012-01-30 DIAGNOSIS — Z113 Encounter for screening for infections with a predominantly sexual mode of transmission: Secondary | ICD-10-CM

## 2012-01-30 DIAGNOSIS — B20 Human immunodeficiency virus [HIV] disease: Secondary | ICD-10-CM

## 2012-01-30 LAB — CBC
HCT: 40.8 % (ref 39.0–52.0)
Hemoglobin: 13.4 g/dL (ref 13.0–17.0)
RBC: 4.27 MIL/uL (ref 4.22–5.81)
WBC: 4.9 10*3/uL (ref 4.0–10.5)

## 2012-01-30 LAB — COMPREHENSIVE METABOLIC PANEL
BUN: 15 mg/dL (ref 6–23)
CO2: 26 mEq/L (ref 19–32)
Calcium: 9.1 mg/dL (ref 8.4–10.5)
Chloride: 107 mEq/L (ref 96–112)
Creat: 1.27 mg/dL (ref 0.50–1.35)
Total Bilirubin: 0.5 mg/dL (ref 0.3–1.2)

## 2012-01-30 LAB — LIPID PANEL
HDL: 36 mg/dL — ABNORMAL LOW (ref >39)
LDL Cholesterol: 36 mg/dL (ref 0–99)
Triglycerides: 257 mg/dL — ABNORMAL HIGH (ref <150)

## 2012-02-03 ENCOUNTER — Other Ambulatory Visit: Payer: Managed Care, Other (non HMO)

## 2012-02-03 LAB — HIV-1 RNA QUANT-NO REFLEX-BLD
HIV 1 RNA Quant: <20 copies/mL (ref <20)
HIV-1 RNA Quant, Log: <1.3 {Log} (ref <1.30)

## 2012-02-12 ENCOUNTER — Ambulatory Visit (INDEPENDENT_AMBULATORY_CARE_PROVIDER_SITE_OTHER): Payer: Managed Care, Other (non HMO) | Admitting: Infectious Diseases

## 2012-02-12 ENCOUNTER — Encounter: Payer: Self-pay | Admitting: Infectious Diseases

## 2012-02-12 VITALS — Temp 98.0°F | Ht 65.0 in | Wt 178.0 lb

## 2012-02-12 DIAGNOSIS — Z113 Encounter for screening for infections with a predominantly sexual mode of transmission: Secondary | ICD-10-CM

## 2012-02-12 DIAGNOSIS — M1990 Unspecified osteoarthritis, unspecified site: Secondary | ICD-10-CM | POA: Insufficient documentation

## 2012-02-12 DIAGNOSIS — M129 Arthropathy, unspecified: Secondary | ICD-10-CM

## 2012-02-12 DIAGNOSIS — Z79899 Other long term (current) drug therapy: Secondary | ICD-10-CM

## 2012-02-12 DIAGNOSIS — B20 Human immunodeficiency virus [HIV] disease: Secondary | ICD-10-CM

## 2012-02-12 NOTE — Assessment & Plan Note (Signed)
Suggested this could be due to his exertion. Try motrin with food for the next week. Also abstain from lifting weights with his L shoulder for next week as well. Apply heating pad.  Will check ESR, CPR, RF with next lab. His concomitant finger pain is curious, not consistent with his shoulder pain.

## 2012-02-12 NOTE — Progress Notes (Signed)
  Subjective:    Patient ID: Elijah Guerra, male    DOB: January 25, 1971, 41 y.o.   MRN: [id number]  HPI 41 yo M with HIV+. At last lab had Trig 257 and Glc 117.  HIV 1 RNA Quant (copies/mL)  Date Value  01/30/2012 <20   02/22/2011 <20   12/29/2009 51*     CD4 T Cell Abs (cmm)  Date Value  01/30/2012 440   02/22/2011 480   12/29/2009 310*    Feet are doing better, quit peeling and cleared up. Thinks he got from his partner whose feet were also peeling and then treated/resolved. Taking atripla without problem.  Having tenderness in fingers, L shoulder. Has joined a gym but pain started before. Not related to weather. No joint swelling. No heat in joints.     Review of Systems     Objective:   Physical Exam  Constitutional: He appears well-developed and well-nourished.  HENT:  Mouth/Throat: No oropharyngeal exudate.  Eyes: EOM are normal. Pupils are equal, round, and reactive to light.  Neck: Neck supple.  Cardiovascular: Normal rate, regular rhythm and normal heart sounds.   Pulmonary/Chest: Effort normal and breath sounds normal.  Abdominal: Soft. Bowel sounds are normal. He exhibits no distension. There is no tenderness.  Musculoskeletal:       Arms: Lymphadenopathy:    He has no cervical adenopathy.          Assessment & Plan:

## 2012-02-12 NOTE — Assessment & Plan Note (Signed)
He is doing very well. Missed flu shot last year/no visit. He is offered condoms/refuses. Will see him back in 6 months (although last time I suggested this he came back in 1 year).

## 2012-02-17 ENCOUNTER — Ambulatory Visit: Payer: Managed Care, Other (non HMO) | Admitting: Infectious Diseases

## 2012-03-24 ENCOUNTER — Telehealth: Payer: Self-pay | Admitting: *Deleted

## 2012-03-24 ENCOUNTER — Other Ambulatory Visit: Payer: Self-pay | Admitting: Licensed Clinical Social Worker

## 2012-03-24 DIAGNOSIS — B20 Human immunodeficiency virus [HIV] disease: Secondary | ICD-10-CM

## 2012-03-24 MED ORDER — EFAVIRENZ-EMTRICITAB-TENOFOVIR 600-200-300 MG PO TABS: 1.0000 | ORAL_TABLET | Freq: Every day | ORAL | Status: DC

## 2012-03-24 NOTE — Telephone Encounter (Signed)
Patient came to clinic today to let us know he needs refills on his Atripla Rx. Advised him they were sent this morning and should be there and ready by lunch.

## 2012-05-10 IMAGING — CR DG CHEST 2V
2 series · 2 of 2 positions shown · non-contrast
Comparison: None.

CLINICAL DATA: Left posterior chest pain

CHEST - 2 VIEW

[w chest pa]
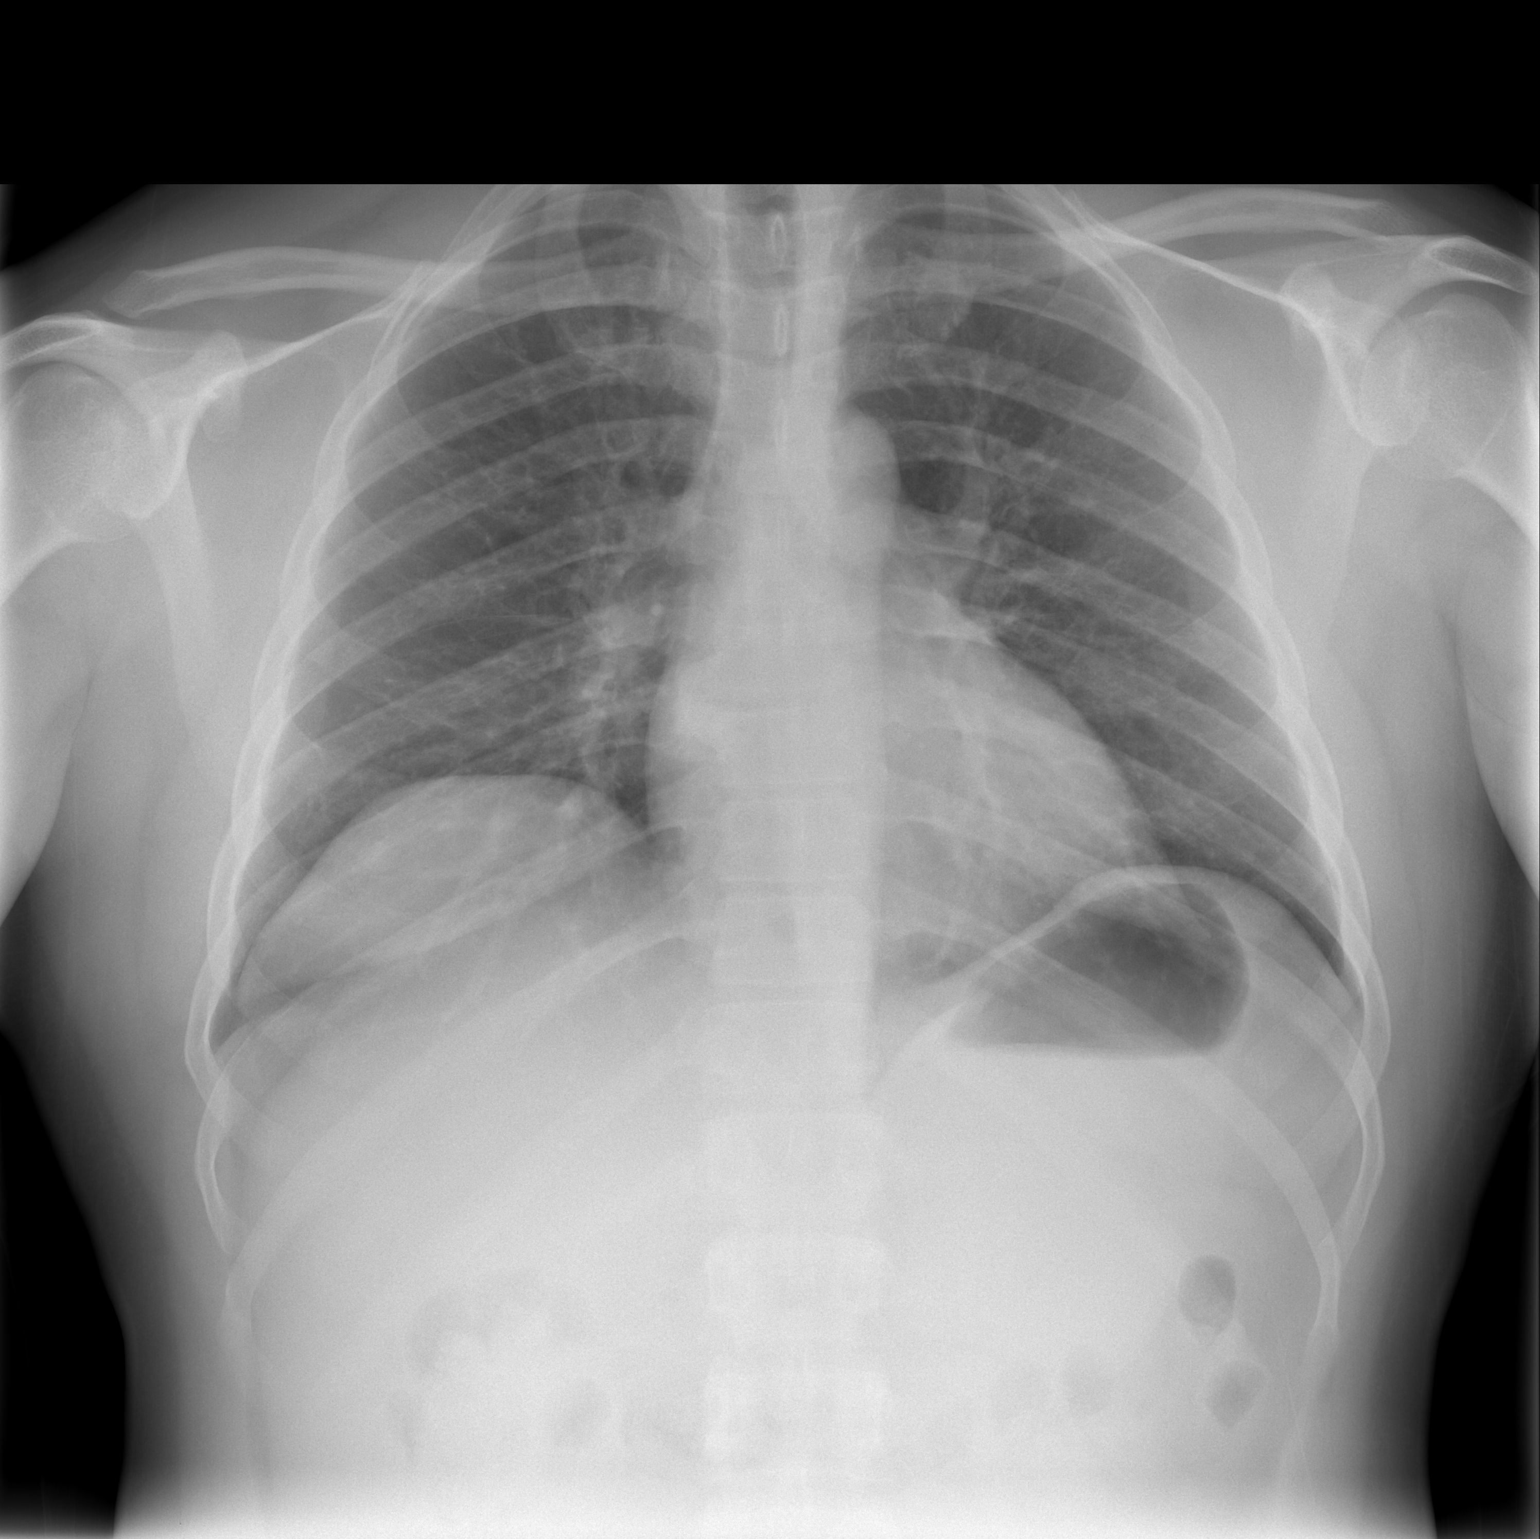

[w chest lat]
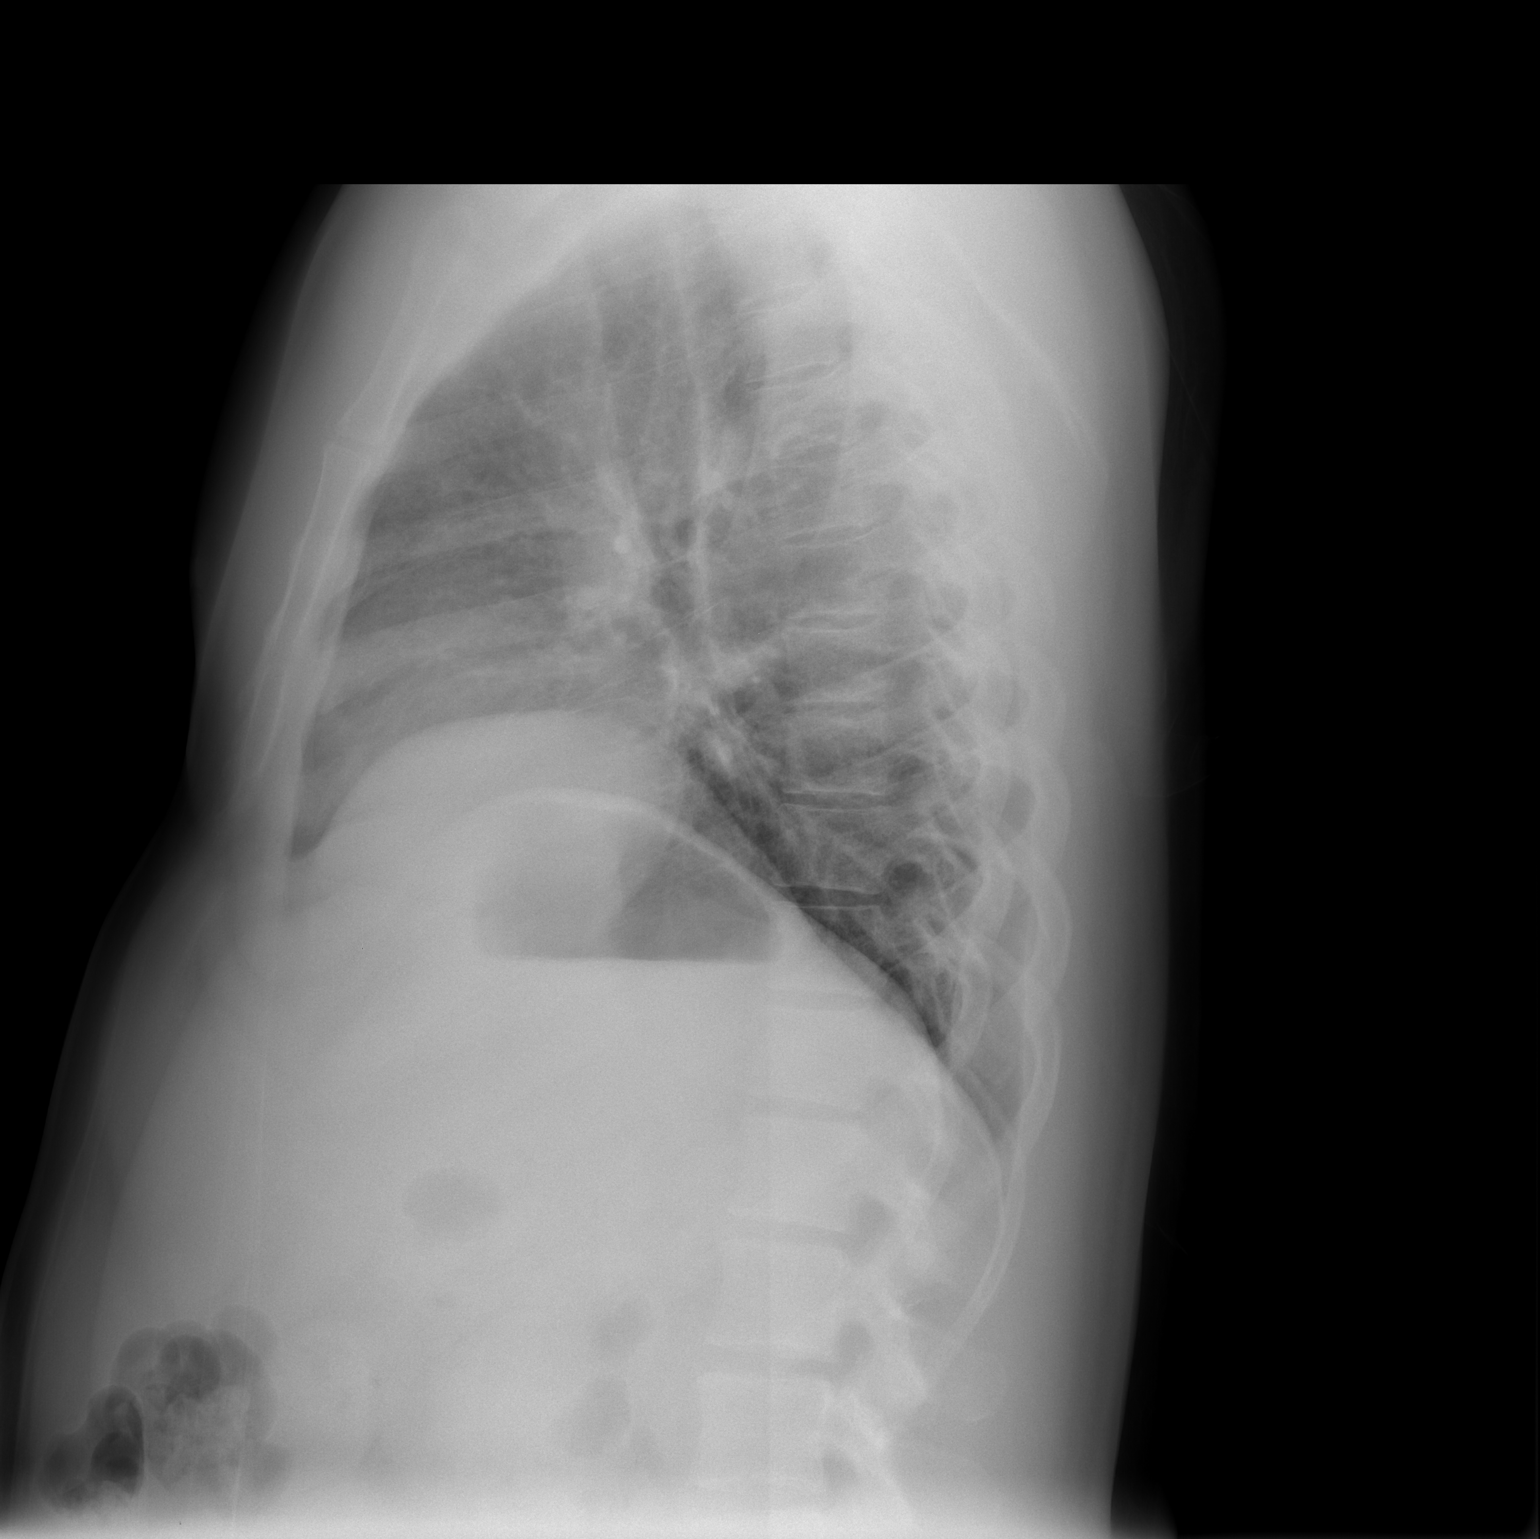

[2 of 2 positions shown; findings below may reference images not displayed]

FINDINGS: Lungs are clear. No pleural effusion or pneumothorax.

Cardiomediastinal silhouette is within normal limits.

Visualized osseous structures are within normal limits.
IMPRESSION: No evidence of acute cardiopulmonary disease.

## 2012-07-10 ENCOUNTER — Other Ambulatory Visit: Payer: Managed Care, Other (non HMO)

## 2012-07-10 DIAGNOSIS — Z79899 Other long term (current) drug therapy: Secondary | ICD-10-CM

## 2012-07-10 DIAGNOSIS — M1990 Unspecified osteoarthritis, unspecified site: Secondary | ICD-10-CM

## 2012-07-10 DIAGNOSIS — Z113 Encounter for screening for infections with a predominantly sexual mode of transmission: Secondary | ICD-10-CM

## 2012-07-10 DIAGNOSIS — B20 Human immunodeficiency virus [HIV] disease: Secondary | ICD-10-CM

## 2012-07-10 LAB — COMPREHENSIVE METABOLIC PANEL
Albumin: 4.2 g/dL (ref 3.5–5.2)
Alkaline Phosphatase: 97 U/L (ref 39–117)
BUN: 17 mg/dL (ref 6–23)
CO2: 25 mEq/L (ref 19–32)
Calcium: 8.6 mg/dL (ref 8.4–10.5)
Chloride: 110 mEq/L (ref 96–112)
Glucose, Bld: 92 mg/dL (ref 70–99)
Potassium: 4.7 mEq/L (ref 3.5–5.3)
Sodium: 141 mEq/L (ref 135–145)
Total Protein: 7 g/dL (ref 6.0–8.3)

## 2012-07-10 LAB — CBC
HCT: 41.5 % (ref 39.0–52.0)
Hemoglobin: 14.4 g/dL (ref 13.0–17.0)
RBC: 4.47 MIL/uL (ref 4.22–5.81)
WBC: 4.9 10*3/uL (ref 4.0–10.5)

## 2012-07-10 LAB — LIPID PANEL
Cholesterol: 124 mg/dL (ref 0–200)
LDL Cholesterol: 51 mg/dL (ref 0–99)
Triglycerides: 203 mg/dL — ABNORMAL HIGH (ref <150)
VLDL: 41 mg/dL — ABNORMAL HIGH (ref 0–40)

## 2012-07-10 LAB — T-HELPER CELL (CD4) - (RCID CLINIC ONLY): CD4 T Cell Abs: 430 uL (ref 400–2700)

## 2012-07-10 LAB — RPR

## 2012-07-13 LAB — HIV-1 RNA QUANT-NO REFLEX-BLD
HIV 1 RNA Quant: <20 copies/mL (ref <20)
HIV-1 RNA Quant, Log: <1.3 {Log} (ref <1.30)

## 2012-07-22 ENCOUNTER — Encounter: Payer: Self-pay | Admitting: Infectious Diseases

## 2012-07-22 ENCOUNTER — Ambulatory Visit (INDEPENDENT_AMBULATORY_CARE_PROVIDER_SITE_OTHER): Payer: Managed Care, Other (non HMO) | Admitting: Infectious Diseases

## 2012-07-22 ENCOUNTER — Other Ambulatory Visit: Payer: Self-pay | Admitting: *Deleted

## 2012-07-22 VITALS — BP 119/80 | HR 79 | Temp 98.3°F | Ht 64.0 in | Wt 182.0 lb

## 2012-07-22 DIAGNOSIS — B20 Human immunodeficiency virus [HIV] disease: Secondary | ICD-10-CM

## 2012-07-22 DIAGNOSIS — K219 Gastro-esophageal reflux disease without esophagitis: Secondary | ICD-10-CM

## 2012-07-22 DIAGNOSIS — K5900 Constipation, unspecified: Secondary | ICD-10-CM

## 2012-07-22 MED ORDER — PANTOPRAZOLE SODIUM 40 MG PO TBEC: 40.0000 mg | DELAYED_RELEASE_TABLET | Freq: Every day | ORAL | Status: DC

## 2012-07-22 MED ORDER — OMEPRAZOLE 20 MG PO CPDR: 20.0000 mg | DELAYED_RELEASE_CAPSULE | Freq: Two times a day (BID) | ORAL | Status: DC

## 2012-07-22 NOTE — Assessment & Plan Note (Signed)
Suggested he try metamucil prn

## 2012-07-22 NOTE — Progress Notes (Signed)
  Subjective:    Patient ID: Elijah Guerra, male    DOB: [birth year], 41 y.o.   MRN: [id number]  HPI 41 yo M with HIV+, taking atripla. No problems with ART. Has been feeling well. Continues to have difficulty with GERD- if eats wrong thing will be up at night, throwing up. Having post-prandial distention. Eats lots of vegetables/salads. Having irregular/incosistent BM. Constipation.   HIV 1 RNA Quant (copies/mL)  Date Value  07/10/2012 <20   01/30/2012 <20   02/22/2011 <20      CD4 T Cell Abs (cmm)  Date Value  07/10/2012 430   01/30/2012 440   02/22/2011 480       Review of Systems  Constitutional: Negative for appetite change and unexpected weight change.  Gastrointestinal: Positive for abdominal pain and constipation. Negative for diarrhea.       GERD.   Genitourinary: Negative for dysuria.       Objective:   Physical Exam  Constitutional: He appears well-developed and well-nourished.  HENT:  Mouth/Throat: No oropharyngeal exudate.  Eyes: EOM are normal. Pupils are equal, round, and reactive to light.  Neck: Neck supple.  Cardiovascular: Normal rate, regular rhythm and normal heart sounds.   Pulmonary/Chest: Effort normal and breath sounds normal.  Abdominal: Soft. Bowel sounds are normal. He exhibits distension. There is no tenderness.  Lymphadenopathy:    He has no cervical adenopathy.          Assessment & Plan:

## 2012-07-22 NOTE — Assessment & Plan Note (Signed)
Doing well, CD4 stable, VL undetectable. Needs flu shot when available. Will see him back in 6 months, labs prior.

## 2012-07-22 NOTE — Assessment & Plan Note (Signed)
Will try prilosec increased dose. If not effective, write insurance override. Consider GI eval, have asked him to call back in 6 weeks if not better on increased PPI dose.

## 2012-10-27 ENCOUNTER — Other Ambulatory Visit: Payer: Self-pay | Admitting: Infectious Diseases

## 2012-12-10 ENCOUNTER — Telehealth: Payer: Self-pay | Admitting: *Deleted

## 2012-12-10 DIAGNOSIS — B20 Human immunodeficiency virus [HIV] disease: Secondary | ICD-10-CM

## 2012-12-10 MED ORDER — EFAVIRENZ-EMTRICITAB-TENOFOVIR 600-200-300 MG PO TABS: 1.0000 | ORAL_TABLET | Freq: Every day | ORAL | Status: DC

## 2012-12-10 NOTE — Telephone Encounter (Signed)
Refill request from [facility] in [place], [place].  Pt's address at [facility] is in [place].  Last OV 06/2012.  Left message on pt's phone to call [facility] to make 6 month f/u appt and let us know where he is currently living.  Need to find out where he is living prior to refilling the Atripla rx. Phone call to [facility] in [place].  Spoke with pharmacist.  [facility] denying the refill and when pt calls about the refill will ask who his PCP is and the address.

## 2012-12-10 NOTE — Telephone Encounter (Signed)
Pt is a [employer].  Currently in [place], [place].  Needing refills added to his Atripla prescription and sent to [facility] in [place], [place] where he is currently.  Pt reminded to make 6 month f/u lab and OV appts.  Pt stated he will make appts when he is coming back [place].

## 2013-05-03 ENCOUNTER — Other Ambulatory Visit: Payer: Self-pay | Admitting: Licensed Clinical Social Worker

## 2013-05-03 DIAGNOSIS — B20 Human immunodeficiency virus [HIV] disease: Secondary | ICD-10-CM

## 2013-05-03 MED ORDER — EFAVIRENZ-EMTRICITAB-TENOFOVIR 600-200-300 MG PO TABS: 1.0000 | ORAL_TABLET | Freq: Every day | ORAL | Status: DC

## 2013-07-20 ENCOUNTER — Other Ambulatory Visit: Payer: Self-pay | Admitting: *Deleted

## 2013-07-20 ENCOUNTER — Other Ambulatory Visit (INDEPENDENT_AMBULATORY_CARE_PROVIDER_SITE_OTHER): Payer: Managed Care, Other (non HMO)

## 2013-07-20 DIAGNOSIS — Z113 Encounter for screening for infections with a predominantly sexual mode of transmission: Secondary | ICD-10-CM

## 2013-07-20 DIAGNOSIS — B20 Human immunodeficiency virus [HIV] disease: Secondary | ICD-10-CM

## 2013-07-20 LAB — COMPREHENSIVE METABOLIC PANEL
ALT: 120 U/L — ABNORMAL HIGH (ref 0–53)
BUN: 14 mg/dL (ref 6–23)
CO2: 26 mEq/L (ref 19–32)
Calcium: 9.1 mg/dL (ref 8.4–10.5)
Chloride: 107 mEq/L (ref 96–112)
Creat: 1.18 mg/dL (ref 0.50–1.35)

## 2013-07-20 LAB — CBC
HCT: 42.1 % (ref 39.0–52.0)
Hemoglobin: 14.4 g/dL (ref 13.0–17.0)
MCV: 96.6 fL (ref 78.0–100.0)
Platelets: 249 10*3/uL (ref 150–400)
RBC: 4.36 MIL/uL (ref 4.22–5.81)
WBC: 5.9 10*3/uL (ref 4.0–10.5)

## 2013-07-20 LAB — LIPID PANEL
Cholesterol: 164 mg/dL (ref 0–200)
Total CHOL/HDL Ratio: 4.6 Ratio

## 2013-07-21 LAB — T-HELPER CELL (CD4) - (RCID CLINIC ONLY): CD4 T Cell Abs: 500 /uL (ref 400–2700)

## 2013-08-30 ENCOUNTER — Encounter: Payer: Self-pay | Admitting: Infectious Diseases

## 2013-08-30 ENCOUNTER — Ambulatory Visit (INDEPENDENT_AMBULATORY_CARE_PROVIDER_SITE_OTHER): Payer: Managed Care, Other (non HMO) | Admitting: Infectious Diseases

## 2013-08-30 VITALS — BP 110/79 | HR 103 | Temp 98.5°F | Ht 65.0 in | Wt 185.0 lb

## 2013-08-30 DIAGNOSIS — Z23 Encounter for immunization: Secondary | ICD-10-CM

## 2013-08-30 DIAGNOSIS — Z79899 Other long term (current) drug therapy: Secondary | ICD-10-CM

## 2013-08-30 DIAGNOSIS — B20 Human immunodeficiency virus [HIV] disease: Secondary | ICD-10-CM

## 2013-08-30 DIAGNOSIS — R739 Hyperglycemia, unspecified: Secondary | ICD-10-CM

## 2013-08-30 DIAGNOSIS — R7309 Other abnormal glucose: Secondary | ICD-10-CM

## 2013-08-30 DIAGNOSIS — N529 Male erectile dysfunction, unspecified: Secondary | ICD-10-CM

## 2013-08-30 DIAGNOSIS — K122 Cellulitis and abscess of mouth: Secondary | ICD-10-CM | POA: Insufficient documentation

## 2013-08-30 DIAGNOSIS — Z113 Encounter for screening for infections with a predominantly sexual mode of transmission: Secondary | ICD-10-CM

## 2013-08-30 DIAGNOSIS — F17200 Nicotine dependence, unspecified, uncomplicated: Secondary | ICD-10-CM

## 2013-08-30 LAB — HEMOGLOBIN A1C: Hgb A1c MFr Bld: 5 % (ref <5.7)

## 2013-08-30 MED ORDER — SILDENAFIL CITRATE 25 MG PO TABS: 25.0000 mg | ORAL_TABLET | Freq: Every day | ORAL | Status: DC | PRN

## 2013-08-30 NOTE — Progress Notes (Signed)
  Subjective:    Patient ID: Elijah Guerra, male    DOB: 19-Apr-1971, 42 y.o.   MRN: [id number]  HPI 42 yo M with hx of HIV+, GERD, c/o pain in mouth from root canal on 10-3. Was put on NSAID and Amoxil afterwards.   HIV 1 RNA Quant (copies/mL)  Date Value  07/20/2013 <20   07/10/2012 <20   01/30/2012 <20      CD4 T Cell Abs (/uL)  Date Value  07/20/2013 500   07/10/2012 430   01/30/2012 440     Still having difficulty with GERD.  Has had difficulty with having an erection. No AM erections.  His Glc was 150 at last visit.    Review of Systems  Constitutional: Negative for fever, chills, appetite change and unexpected weight change.  Gastrointestinal: Positive for constipation. Negative for diarrhea.  Genitourinary: Negative for difficulty urinating.       Occas urinary incont.        Objective:   Physical Exam  Constitutional: He appears well-developed and well-nourished.  HENT:  Mouth/Throat: No oropharyngeal exudate.    Eyes: EOM are normal. Pupils are equal, round, and reactive to light.  Neck: Neck supple.  Cardiovascular: Normal rate, regular rhythm and normal heart sounds.   Pulmonary/Chest: Effort normal and breath sounds normal.  Abdominal: Soft. Bowel sounds are normal. There is no tenderness. There is no rebound.  Lymphadenopathy:    He has no cervical adenopathy.          Assessment & Plan:

## 2013-08-30 NOTE — Assessment & Plan Note (Signed)
States he hs quit. Encouraged.

## 2013-08-30 NOTE — Assessment & Plan Note (Signed)
He is on amoxil. Aside from changing him to augmentin, which I don't think would be that helpful, he needs to see his oral surgeon.

## 2013-08-30 NOTE — Assessment & Plan Note (Signed)
Will check his A1C 

## 2013-08-30 NOTE — Assessment & Plan Note (Signed)
Doing well. Offered/refused condoms. Gets flu shot today. Will see back in 6 months.

## 2013-08-30 NOTE — Assessment & Plan Note (Addendum)
Will give him rx for viagra. Due to increased Glc? Asked to call MD if erection longer than 6-8 hours. No hx of CP.

## 2013-09-13 ENCOUNTER — Telehealth: Payer: Self-pay | Admitting: *Deleted

## 2013-09-13 NOTE — Telephone Encounter (Signed)
Pt called for his A1C results. A1C = 5.0 given.  Andree Coss, RN

## 2013-11-16 ENCOUNTER — Other Ambulatory Visit: Payer: Self-pay | Admitting: Licensed Clinical Social Worker

## 2013-11-16 DIAGNOSIS — B20 Human immunodeficiency virus [HIV] disease: Secondary | ICD-10-CM

## 2013-11-16 MED ORDER — EFAVIRENZ-EMTRICITAB-TENOFOVIR 600-200-300 MG PO TABS: 1.0000 | ORAL_TABLET | Freq: Every day | ORAL | Status: DC

## 2014-03-30 ENCOUNTER — Other Ambulatory Visit: Payer: Self-pay | Admitting: Infectious Diseases

## 2014-04-11 ENCOUNTER — Telehealth: Payer: Self-pay | Admitting: *Deleted

## 2014-04-11 MED ORDER — PANTOPRAZOLE SODIUM 40 MG PO TBEC: 40.0000 mg | DELAYED_RELEASE_TABLET | Freq: Every day | ORAL | Status: DC

## 2014-04-11 NOTE — Telephone Encounter (Signed)
Needs f/u appt w/ Dr. Ninetta LightsHatcher.  Will give 30-day refill until pt can be seen by Dr. Ninetta LightsHatcher.  Protonix is covered by his insurance but Prilosec is not.

## 2014-04-14 ENCOUNTER — Other Ambulatory Visit: Payer: Managed Care, Other (non HMO)

## 2014-04-14 DIAGNOSIS — B20 Human immunodeficiency virus [HIV] disease: Secondary | ICD-10-CM

## 2014-04-14 DIAGNOSIS — Z113 Encounter for screening for infections with a predominantly sexual mode of transmission: Secondary | ICD-10-CM

## 2014-04-14 DIAGNOSIS — Z79899 Other long term (current) drug therapy: Secondary | ICD-10-CM

## 2014-04-14 LAB — CBC WITH DIFFERENTIAL/PLATELET
BASOS ABS: 0 10*3/uL (ref 0.0–0.1)
BASOS PCT: 0 % (ref 0–1)
EOS ABS: 0.4 10*3/uL (ref 0.0–0.7)
Eosinophils Relative: 6 % — ABNORMAL HIGH (ref 0–5)
HCT: 43.1 % (ref 39.0–52.0)
Hemoglobin: 14.7 g/dL (ref 13.0–17.0)
Lymphocytes Relative: 39 % (ref 12–46)
Lymphs Abs: 2.6 10*3/uL (ref 0.7–4.0)
MCH: 32.9 pg (ref 26.0–34.0)
MCHC: 34.1 g/dL (ref 30.0–36.0)
MCV: 96.4 fL (ref 78.0–100.0)
Monocytes Absolute: 0.6 10*3/uL (ref 0.1–1.0)
Monocytes Relative: 9 % (ref 3–12)
NEUTROS PCT: 46 % (ref 43–77)
Neutro Abs: 3.1 10*3/uL (ref 1.7–7.7)
PLATELETS: 219 10*3/uL (ref 150–400)
RBC: 4.47 MIL/uL (ref 4.22–5.81)
RDW: 12.5 % (ref 11.5–15.5)
WBC: 6.7 10*3/uL (ref 4.0–10.5)

## 2014-04-14 LAB — COMPLETE METABOLIC PANEL WITH GFR
ALK PHOS: 87 U/L (ref 39–117)
ALT: 76 U/L — AB (ref 0–53)
AST: 57 U/L — ABNORMAL HIGH (ref 0–37)
Albumin: 4 g/dL (ref 3.5–5.2)
BILIRUBIN TOTAL: 0.4 mg/dL (ref 0.2–1.2)
BUN: 16 mg/dL (ref 6–23)
CO2: 25 mEq/L (ref 19–32)
Calcium: 9 mg/dL (ref 8.4–10.5)
Chloride: 107 mEq/L (ref 96–112)
Creat: 1.01 mg/dL (ref 0.50–1.35)
GFR, Est African American: >89 mL/min
GFR, Est Non African American: >89 mL/min
Glucose, Bld: 88 mg/dL (ref 70–99)
Potassium: 4 mEq/L (ref 3.5–5.3)
SODIUM: 141 meq/L (ref 135–145)
Total Protein: 7.1 g/dL (ref 6.0–8.3)

## 2014-04-14 LAB — LIPID PANEL
CHOL/HDL RATIO: 3.8 ratio
CHOLESTEROL: 132 mg/dL (ref 0–200)
HDL: 35 mg/dL — ABNORMAL LOW (ref >39)
LDL Cholesterol: 73 mg/dL (ref 0–99)
TRIGLYCERIDES: 120 mg/dL (ref <150)
VLDL: 24 mg/dL (ref 0–40)

## 2014-04-15 LAB — T-HELPER CELL (CD4) - (RCID CLINIC ONLY)
CD4 T CELL HELPER: 25 % — AB (ref 33–55)
CD4 T Cell Abs: 700 /uL (ref 400–2700)

## 2014-04-15 LAB — RPR

## 2014-04-15 LAB — HIV-1 RNA QUANT-NO REFLEX-BLD
HIV 1 RNA Quant: <20 copies/mL (ref <20)
HIV-1 RNA Quant, Log: <1.3 {Log} (ref <1.30)

## 2014-05-11 ENCOUNTER — Other Ambulatory Visit: Payer: Self-pay | Admitting: Infectious Diseases

## 2014-05-15 ENCOUNTER — Encounter (HOSPITAL_COMMUNITY): Payer: Self-pay | Admitting: Emergency Medicine

## 2014-05-15 ENCOUNTER — Emergency Department (HOSPITAL_COMMUNITY)
Admission: EM | Admit: 2014-05-15 | Discharge: 2014-05-15 | Disposition: A | Discharge status: Home / Self Care | Payer: Managed Care, Other (non HMO) | Attending: Emergency Medicine | Admitting: Emergency Medicine

## 2014-05-15 DIAGNOSIS — Z79899 Other long term (current) drug therapy: Secondary | ICD-10-CM | POA: Insufficient documentation

## 2014-05-15 DIAGNOSIS — F17200 Nicotine dependence, unspecified, uncomplicated: Secondary | ICD-10-CM | POA: Insufficient documentation

## 2014-05-15 DIAGNOSIS — Z21 Asymptomatic human immunodeficiency virus [HIV] infection status: Secondary | ICD-10-CM | POA: Insufficient documentation

## 2014-05-15 DIAGNOSIS — L0501 Pilonidal cyst with abscess: Secondary | ICD-10-CM | POA: Insufficient documentation

## 2014-05-15 HISTORY — DX: Asymptomatic human immunodeficiency virus (hiv) infection status: Z21

## 2014-05-15 HISTORY — DX: Human immunodeficiency virus (HIV) disease: B20

## 2014-05-15 MED ORDER — HYDROCODONE-ACETAMINOPHEN 5-325 MG PO TABS: 1.0000 | ORAL_TABLET | ORAL | Status: DC | PRN

## 2014-05-15 MED ORDER — SULFAMETHOXAZOLE-TRIMETHOPRIM 800-160 MG PO TABS: 1.0000 | ORAL_TABLET | Freq: Two times a day (BID) | ORAL | Status: AC

## 2014-05-15 NOTE — ED Notes (Signed)
Patient states that he has had a bump to his lower back. It burst and sprayed, and now it is spreading. Has had this in the past and they have had to drain it.

## 2014-05-15 NOTE — Discharge Instructions (Signed)
Take antibiotic as directed to completion. Take Vicodin for severe pain only. No driving or operating heavy machinery while taking vicodin. This medication may cause drowsiness.  Pilonidal Cyst, Care After A pilonidal cyst occurs when hairs get trapped (ingrown) beneath the skin in the crease between the buttocks over your sacrum (the bone under that crease). Pilonidal cysts are most common in young men with a lot of body hair. When the cyst breaks(ruptured) or leaks, fluid from the cyst may cause burning and itching. If the cyst becomes infected, it causes a painful swelling filled with pus (abscess). The pus and trapped hairs need to be removed (often by lancing) so that the infection can heal. The word pilonidal means hair nest. HOME CARE INSTRUCTIONS If the pilonidal sinus was NOT DRAINING OR LANCED:  Keep the area clean and dry. Bathe or shower daily. Wash the area well with a germ-killing soap. Hot tub baths may help prevent infection. Dry the area well with a towel.  Avoid tight clothing in order to keep area as moisture-free as possible.  Keep area between buttocks as free from hair as possible. A depilatory may be used.  Take antibiotics as directed.  Only take over-the-counter or prescription medicines for pain, discomfort, or fever as directed by your caregiver. If the cyst WAS INFECTED AND NEEDED TO BE DRAINED:  Your caregiver may have packed the wound with gauze to keep the wound open. This allows the wound to heal from the inside outward and continue to drain.  Return as directed for a wound check.  If you take tub baths or showers, repack the wound with gauze as directed following. Sponge baths are a good alternative. Sitz baths may be used three to four times a day or as directed.  If an antibiotic was ordered to fight the infection, take as directed.  Only take over-the-counter or prescription medicines for pain, discomfort, or fever as directed by your caregiver.  If a  drain was in place and removed, use sitz baths for 20 minutes 4 times per day. Clean the wound gently with mild unscented soap, pat dry, and then apply a dry dressing as directed. If you had surgery and IT WAS MARSUPIALIZED (LEFT OPEN):  Your wound was packed with gauze to keep the wound open. This allows the wound to heal from the inside outwards and continue draining. The changing of the dressing regularly also helps keep the wound clean.  Return as directed for a wound check.  If you take tub baths or showers, repack the wound with gauze as directed following. Sponge baths are a good alternative. Sitz baths can also be used. This may be done three to four times a day or as directed.  If an antibiotic was ordered to fight the infection, take as directed.  Only take over-the-counter or prescription medicines for pain, discomfort, or fever as directed by your caregiver.  If you had surgery and the wound was closed you may care for it as directed. This generally includes keeping it dry and clean and dressing it as directed. SEEK MEDICAL CARE IF:   You have increased pain, swelling, redness, drainage, or bleeding from the area.  You have a fever.  You have muscles aches, dizziness, or a general ill feeling. Document Released: 12/12/2006 Document Revised: 07/14/2013 Document Reviewed: 02/26/2007 [facility] Patient Information 2015 [employer], [place]. This information is not intended to replace advice given to you by your health care provider. Make sure you discuss any questions you have  with your health care provider.  Abscess An abscess is an infected area that contains a collection of pus and debris.It can occur in almost any part of the body. An abscess is also known as a furuncle or boil. CAUSES  An abscess occurs when tissue gets infected. This can occur from blockage of oil or sweat glands, infection of hair follicles, or a minor injury to the skin. As the body tries to fight the infection,  pus collects in the area and creates pressure under the skin. This pressure causes pain. People with weakened immune systems have difficulty fighting infections and get certain abscesses more often.  SYMPTOMS Usually an abscess develops on the skin and becomes a painful mass that is red, warm, and tender. If the abscess forms under the skin, you may feel a moveable soft area under the skin. Some abscesses break open (rupture) on their own, but most will continue to get worse without care. The infection can spread deeper into the body and eventually into the bloodstream, causing you to feel ill.  DIAGNOSIS  Your caregiver will take your medical history and perform a physical exam. A sample of fluid may also be taken from the abscess to determine what is causing your infection. TREATMENT  Your caregiver may prescribe antibiotic medicines to fight the infection. However, taking antibiotics alone usually does not cure an abscess. Your caregiver may need to make a small cut (incision) in the abscess to drain the pus. In some cases, gauze is packed into the abscess to reduce pain and to continue draining the area. HOME CARE INSTRUCTIONS   Only take over-the-counter or prescription medicines for pain, discomfort, or fever as directed by your caregiver.  If you were prescribed antibiotics, take them as directed. Finish them even if you start to feel better.  If gauze is used, follow your caregiver's directions for changing the gauze.  To avoid spreading the infection:  Keep your draining abscess covered with a bandage.  Wash your hands well.  Do not share personal care items, towels, or whirlpools with others.  Avoid skin contact with others.  Keep your skin and clothes clean around the abscess.  Keep all follow-up appointments as directed by your caregiver. SEEK MEDICAL CARE IF:   You have increased pain, swelling, redness, fluid drainage, or bleeding.  You have muscle aches, chills, or a  general ill feeling.  You have a fever. MAKE SURE YOU:   Understand these instructions.  Will watch your condition.  Will get help right away if you are not doing well or get worse. Document Released: 08/21/2005 Document Revised: 05/12/2012 Document Reviewed: 01/24/2012 [facility] Patient Information 2015 [place], [place]. This information is not intended to replace advice given to you by your health care provider. Make sure you discuss any questions you have with your health care provider.

## 2014-05-15 NOTE — ED Provider Notes (Signed)
CSN: [id number]     Arrival date & time 05/15/14  0546 History   First MD Initiated Contact with Patient 05/15/14 [phone]     Chief Complaint  Patient presents with  . Recurrent Skin Infections     (Consider location/radiation/quality/duration/timing/severity/associated sxs/prior Treatment) HPI Comments: 43 year old male with a past medical history of HIV presents to the emergency department complaining of a bump on his lower back x1 week that scratched a few days ago causing it to "burst and spray". Patient states since then the bump is spreading and becoming painful. States he feels like it is the size of a golf ball. States he had something similar in the past and had to have this drained. Denies fever or chills.  The history is provided by the patient.    Past Medical History  Diagnosis Date  . HIV (human immunodeficiency virus infection)    History reviewed. No pertinent past surgical history. History reviewed. No pertinent family history. History  Substance Use Topics  . Smoking status: Current Some Day Smoker -- 0.30 packs/day    Types: Cigars  . Smokeless tobacco: Never Used     Comment: not currently smoking  . Alcohol Use: No    Review of Systems  Skin: Positive for wound.  All other systems reviewed and are negative.     Allergies  Review of patient's allergies indicates no known allergies.  Home Medications   Prior to Admission medications   Medication Sig Start Date End Date Taking? Authorizing Provider  efavirenz-emtricitabine-tenofovir (ATRIPLA) 600-200-300 MG per tablet Take 1 tablet by mouth at bedtime.   Yes Historical Provider, MD  pantoprazole (PROTONIX) 40 MG tablet Take 40 mg by mouth daily.   Yes Historical Provider, MD  sildenafil (VIAGRA) 25 MG tablet Take 1 tablet (25 mg total) by mouth daily as needed for erectile dysfunction. 08/30/13  Yes Ginnie SmartJeffrey C Hatcher, MD  HYDROcodone-acetaminophen (NORCO/VICODIN) 5-325 MG per tablet Take 1-2 tablets by  mouth every 4 (four) hours as needed. 05/15/14   Trevor Maceobyn M Albert, PA-C  sulfamethoxazole-trimethoprim (BACTRIM DS,SEPTRA DS) 800-160 MG per tablet Take 1 tablet by mouth 2 (two) times daily. 05/15/14 05/22/14  Nada Boozerobyn M Albert, PA-C   BP 135/75  Pulse 88  Temp(Src) 98.8 F (37.1 C) (Oral)  Resp 16  SpO2 100% Physical Exam  Nursing note and vitals reviewed. Constitutional: He is oriented to person, place, and time. He appears well-developed and well-nourished. No distress.  HENT:  Head: Normocephalic and atraumatic.  Eyes: Conjunctivae and EOM are normal.  Neck: Normal range of motion. Neck supple.  Cardiovascular: Normal rate, regular rhythm and normal heart sounds.   Pulmonary/Chest: Effort normal and breath sounds normal.  Abdominal: Soft. Bowel sounds are normal. There is no tenderness.  Musculoskeletal: Normal range of motion. He exhibits no edema.  Neurological: He is alert and oriented to person, place, and time.  Skin: Skin is warm and dry.  2 cm area of induration over sacral area without erythema or warmth. Tender. Tracking down left side of buttock area. No peri-rectal abscess. No drainage.  Psychiatric: He has a normal mood and affect. His behavior is normal.    ED Course  Procedures (including critical care time) INCISION AND DRAINAGE Performed by: Johnnette GourdAlbert, Robyn Consent: Verbal consent obtained. Risks and benefits: risks, benefits and alternatives were discussed Type: abscess  Body area: sacral area  Anesthesia: local infiltration  Incision was made with a scalpel.  Local anesthetic: lidocaine 2% with epinephrine  Anesthetic total: 5 ml  Complexity: complex Blunt dissection to break up loculations  Drainage: purulent  Drainage amount: moderate  Packing material: none  Patient tolerance: Patient tolerated the procedure well with no immediate complications.    Labs Review Labs Reviewed - No data to display  Imaging Review No results found.   EKG  Interpretation None      MDM   Final diagnoses:  Pilonidal abscess   Patient presenting with pilonidal abscess. He is well appearing and in no apparent distress. Afebrile, vital signs stable. No perirectal abscess. Abscess drained with moderate amount of purulent fluid. Given that this is any dirty area, will start patient on antibiotics. Followup with PCP in 2-3 days for recheck. Stable for discharge. Return precautions given. Patient states understanding of treatment care plan and is agreeable.  Trevor MaceRobyn M Albert, PA-C 05/15/14 [phone]

## 2014-05-15 NOTE — ED Notes (Signed)
MD at bedside. 

## 2014-05-15 NOTE — ED Notes (Signed)
PA at bedside.

## 2014-05-18 NOTE — ED Provider Notes (Signed)
Medical screening examination/treatment/procedure(s) were performed by non-physician practitioner and as supervising physician I was immediately available for consultation/collaboration.   EKG Interpretation None        Rolland PorterMark James, MD 05/18/14 [phone]

## 2014-06-08 ENCOUNTER — Other Ambulatory Visit: Payer: Self-pay | Admitting: *Deleted

## 2014-06-08 DIAGNOSIS — B20 Human immunodeficiency virus [HIV] disease: Secondary | ICD-10-CM

## 2014-06-08 MED ORDER — EFAVIRENZ-EMTRICITAB-TENOFOVIR 600-200-300 MG PO TABS: 1.0000 | ORAL_TABLET | Freq: Every day | ORAL | Status: DC

## 2014-07-18 ENCOUNTER — Ambulatory Visit: Payer: Managed Care, Other (non HMO) | Admitting: Infectious Diseases

## 2014-08-18 ENCOUNTER — Other Ambulatory Visit: Payer: Self-pay | Admitting: Infectious Diseases

## 2014-08-25 ENCOUNTER — Other Ambulatory Visit: Payer: Self-pay | Admitting: Licensed Clinical Social Worker

## 2014-08-25 DIAGNOSIS — B20 Human immunodeficiency virus [HIV] disease: Secondary | ICD-10-CM

## 2014-08-25 MED ORDER — EFAVIRENZ-EMTRICITAB-TENOFOVIR 600-200-300 MG PO TABS: 1.0000 | ORAL_TABLET | Freq: Every day | ORAL | Status: DC

## 2014-09-15 ENCOUNTER — Other Ambulatory Visit: Payer: Self-pay | Admitting: Infectious Diseases

## 2014-09-15 ENCOUNTER — Encounter: Payer: Self-pay | Admitting: Internal Medicine

## 2014-09-15 ENCOUNTER — Ambulatory Visit (INDEPENDENT_AMBULATORY_CARE_PROVIDER_SITE_OTHER): Payer: Managed Care, Other (non HMO) | Admitting: Internal Medicine

## 2014-09-15 ENCOUNTER — Other Ambulatory Visit: Payer: Self-pay | Admitting: *Deleted

## 2014-09-15 VITALS — BP 106/71 | HR 80 | Temp 98.6°F | Wt 189.0 lb

## 2014-09-15 DIAGNOSIS — B20 Human immunodeficiency virus [HIV] disease: Secondary | ICD-10-CM

## 2014-09-15 DIAGNOSIS — L259 Unspecified contact dermatitis, unspecified cause: Secondary | ICD-10-CM

## 2014-09-15 DIAGNOSIS — L0293 Carbuncle, unspecified: Secondary | ICD-10-CM

## 2014-09-15 DIAGNOSIS — L0292 Furuncle, unspecified: Secondary | ICD-10-CM

## 2014-09-15 DIAGNOSIS — Z23 Encounter for immunization: Secondary | ICD-10-CM

## 2014-09-15 MED ORDER — MUPIROCIN 2 % EX OINT: 1.0000 "application " | TOPICAL_OINTMENT | Freq: Two times a day (BID) | CUTANEOUS | Status: DC

## 2014-09-15 MED ORDER — HYDROCORTISONE 1 % EX LOTN: 1.0000 "application " | TOPICAL_LOTION | Freq: Two times a day (BID) | CUTANEOUS | Status: DC

## 2014-09-15 MED ORDER — CHLORHEXIDINE GLUCONATE 4 % EX LIQD: Freq: Every day | CUTANEOUS | Status: DC

## 2014-09-15 MED ORDER — PANTOPRAZOLE SODIUM 40 MG PO TBEC: 40.0000 mg | DELAYED_RELEASE_TABLET | Freq: Every day | ORAL | Status: DC

## 2014-09-15 MED ORDER — EFAVIRENZ-EMTRICITAB-TENOFOVIR 600-200-300 MG PO TABS: 1.0000 | ORAL_TABLET | Freq: Every day | ORAL | Status: DC

## 2014-09-15 NOTE — Assessment & Plan Note (Signed)
Will try otc steroid cream

## 2014-09-15 NOTE — Assessment & Plan Note (Signed)
Doing good.  Labs today and rtc 6 months.  

## 2014-09-15 NOTE — Patient Instructions (Signed)
[facility] Five day Treatment Plan for MRSA (Staph) Decolonization  Please let us know if you have questions or concerns or do not understand the information we give you.  Prepare Chose a period when you will be uninterrupted by going away or other distractions. To ensure that your skin is in good condition, follow the Routine Skin Care principles below to reduce drying and enhance healing. Do not start while you have any active boils. Routine Skin Care principles to reduce drying and enhance healing:  Avoid the use of soap when bathing or showering. DO NOT routinely use antiseptic solutions. If you need to use something, chose a soap substitute (examples -QV Wash or Cetaphil).   When drying with a towel, be gentle and pat dry your skin. Avoid rubbing the skin.   Reduce the overall frequency of bathing or showering. A short shower (3 minutes) is better than a bath in terms of its effect on the skin.   Use a simple sorbelene based-cream on your skin prior to showering and immediately after drying. (Examples: Hydroderm or other Sorbelene-based preparations). Especially protect healing or dry areas of skin in this way. Don't use a barrier cream with a vaseline base or with perfumes and additives. You are more likely to be allergic to these products, and cause further damage to your skin.   Make sure that you clean and cover any skin cuts or grazes that occur. Try to avoid picking or biting fingernails and the skin around the nails Keep your fingernails clipped short and clean to reduce problems caused by scratching.   For itchy skin, try gently massaging sorbelene-based cream into itchy areas instead of scratching it. A long-acting non-sedating antihistamine drug is the next option to try. However make sure that you are not taking medication that will interact with this drug type.                                                                         To prepare yourself  for your treatment, it is recommended that you complete the following steps:  Remove nose, ear and other body piercing items for several days prior to the treatment and keep them out during the treatment period   Purchase a new toothbrush, disposable razor (if used), sterident for dentures (if required) and a container of alcohol hand hygiene solution (gel or rub)   Discard old toothbrushes and razors when the treatment starts. Also discard opened deodorant rollers, skin adhesive tapes, skin creams and solutions- all of these may already be contaminated with staph   Discard pumice stone(s), sponges and disposable face cloths if used    Discard all make-up brushes, creams, and implements   Discard or hot wash all fluffy toys   Wash hair brush and comb, nail files, plastic toys and cutters in the dishwasher or purchase new ones   Remove nail varnish and artificial nails  Daily routine for 5 days     *Minimize contact with members of the community during the 5 days of treatment* Body washes The effectiveness of the program increases if the correct procedure is used:  Apply the provided antiseptic body wash (2% chlorhexidine) in the shower daily   Take   care to wash hair, under the arms and into the groin and into any folds of skin   Allow the antiseptic to remain on the skin for at least 5 minutes  Nasal ointment  Disinfect your hands with alcohol gel/rub and allow to dry    Open the mupirocin 2% (Bactroban) nasal ointment.   Place small amount (size of match head) of ointment onto a clean cotton swab and massage gently around the inside of the nostril on one side, making sure not to insert it too deeply (no more than 2 cm or a little less than an inch).   Use a new cotton bud for the other nostril so that you do not contaminate the Bactroban tube with staph.    After applying the ointment, press a finger against the nose next to the nostril opening and use a circular motion to  spread the ointment within the nose   Apply the mupirocin ointment two times a day for 5 days   Disinfect your hands with alcohol rub/gel after applying the ointmentp>  Personal items (combs, razor, eyeglasses, jewelry, etc.)     Disinfect all personal items daily with an alcohol-based cleanser  House Environment and Clothes/Linens  On day 2 and 5 of the treatment, clean your house, (especially the bedroom and bathroom). Clean dust off all surfaces and then vacuum clean all floor surfaces AND soft furnishings (such as your favorite chair). If your chair/couch has a vinyl or leather covering then wipe over the chair with warm soapy water and then dry with a clean towel (which should then be washed). Staph lives in skin scales from humans that contaminate the environment. This can lead to re-infection.    Disinfect the shower floor and/or bath tub daily    On days 1, 3, AND 5 of the treatment wash your clothes, underwear, pajamas and bed linen (such as towels, sheets, washcloths, and bath mats). A hot wash with laundry detergent is best (there is no need to use expensive laundry detergent or powder). Dry clothes in sun if possible. Change into clean clothes or pajamas on those days after your shower.    Do not share or exchange any personal items of clothing  Sports/Gym     Surfaces, equipment and towels, and skin-to-skin contact are all potential sources for staph re-infection Pets Dogs and other companion animals can also be colonized with the same strains of MRSA. Best to wash or replace bedding material for the animal and wash the animal at least once during the treatment period with antiseptic solution (2% chlorhexidine wash). Ensure that the skin of the animal is kept in good condition. If the pet has any chronic skin disorders, consult your vet prior to starting your treatment process. What about my partner, family, or household members? Usually when an aggressive strain of staph moves in  to a family or household, only certain members of that group get infections (boils). This is despite the fact that the strain has probably transferred itself from person to person within the group. Those without boils may also be carrying the bacterium, however they must have better resistance (immunity) or perhaps have better skin condition. Staph likes to invade through cuts, scratches and skin with dermatitis or dryness.    Follow-up after decolonization treatment  Possible approaches will vary depending on how your treatment goes. Your provider will instruct you on the follow-up best suited for you. Some options are:   Wait and see - if no further   boils occur within 6 months then it is probable that the strain of staph has been eliminated from you.    Continue intermittent body washes 1-2 times per week with 2% aqueous chlorhexidine soap preparation or similar   Future antibiotic use  The best preventative approach to avoiding future problems may be to avoid use of antibiotics unless there is a strong indication. Antibiotics are often prescribed for minor infections or for respiratory infections that are mostly due to viruses. It is in your best interest to ride these infections out rather than taking antibiotics. Taking antibiotics alters your natural bacterial flora on your skin and in your gut. This may reduce your resistance against acquiring a resistant bacterial strain such as MRSA).   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   References Wiese-Posselt et al. Clin Inf Dis 2007:44:e88  CDC:  http://www.cdc.gov/ncidod/dhqp/ar_mrsa_ca.html 

## 2014-09-15 NOTE — Assessment & Plan Note (Signed)
Will try decontamination with hibiclens and bactroban

## 2014-09-15 NOTE — Progress Notes (Signed)
   Subjective:    Patient ID: Elijah Guerra, male    DOB: 05/10/1971, 43 y.o.   MRN: [id number]  HPI He is here for follow up.  On Atripla with no issues, no missed doses.  Last CD4 700 and continued undetectable vl.  Has a rash on back.  Continues to have recurrent boils.  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     Review of Systems  Constitutional: Negative for fever and fatigue.  HENT: Negative for sore throat.   Gastrointestinal: Negative for nausea and diarrhea.  Skin: Positive for rash.  Neurological: Negative for dizziness and light-headedness.  Hematological: Negative for adenopathy.       Objective:   Physical Exam  Constitutional: He appears well-developed and well-nourished. No distress.  HENT:  Mouth/Throat: No oropharyngeal exudate.  Eyes: No scleral icterus.  Cardiovascular: Normal rate, regular rhythm and normal heart sounds.   No murmur heard. Pulmonary/Chest: Effort normal and breath sounds normal. No respiratory distress.  Lymphadenopathy:    He has no cervical adenopathy.  Skin: Rash noted.  + hyperpigmented, palpable, puritic          Assessment & Plan:

## 2014-09-16 LAB — T-HELPER CELL (CD4) - (RCID CLINIC ONLY)
CD4 % Helper T Cell: 27 % — ABNORMAL LOW (ref 33–55)
CD4 T Cell Abs: 710 /uL (ref 400–2700)

## 2014-09-18 LAB — HIV-1 RNA QUANT-NO REFLEX-BLD
HIV 1 RNA Quant: <20 copies/mL (ref <20)
HIV-1 RNA Quant, Log: <1.3 {Log} (ref <1.30)

## 2014-09-19 ENCOUNTER — Other Ambulatory Visit: Payer: Self-pay | Admitting: Licensed Clinical Social Worker

## 2014-09-19 ENCOUNTER — Telehealth: Payer: Self-pay | Admitting: Licensed Clinical Social Worker

## 2014-09-19 DIAGNOSIS — L0292 Furuncle, unspecified: Secondary | ICD-10-CM

## 2014-09-19 DIAGNOSIS — L0293 Carbuncle, unspecified: Principal | ICD-10-CM

## 2014-09-19 MED ORDER — DOXYCYCLINE HYCLATE 100 MG PO TABS: 100.0000 mg | ORAL_TABLET | Freq: Two times a day (BID) | ORAL | Status: DC

## 2014-09-19 NOTE — Telephone Encounter (Signed)
Patient states he wanted an antibiotic to take by mouth instead of the cream because he is in pain and the cream is not doing much. 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

## 2014-09-19 NOTE — Addendum Note (Signed)
Addended by: Cliffton AstersAMPBELL, Meryle Pugmire on: 09/19/2014 02:44 PM   Modules accepted: Orders

## 2014-09-19 NOTE — Telephone Encounter (Signed)
Mr. Elijah Guerra called and stated that he had a new boil on his buttocks. I will prescribe a short course of oral doxycycline.

## 2015-01-13 ENCOUNTER — Other Ambulatory Visit: Payer: Managed Care, Other (non HMO)

## 2015-01-13 ENCOUNTER — Other Ambulatory Visit: Payer: Self-pay | Admitting: Internal Medicine

## 2015-01-13 ENCOUNTER — Other Ambulatory Visit: Payer: Self-pay | Admitting: Licensed Clinical Social Worker

## 2015-01-13 DIAGNOSIS — B20 Human immunodeficiency virus [HIV] disease: Secondary | ICD-10-CM

## 2015-01-13 LAB — CBC WITH DIFFERENTIAL/PLATELET
BASOS PCT: 0 % (ref 0–1)
Basophils Absolute: 0 10*3/uL (ref 0.0–0.1)
Eosinophils Absolute: 0.3 10*3/uL (ref 0.0–0.7)
Eosinophils Relative: 5 % (ref 0–5)
HEMATOCRIT: 42 % (ref 39.0–52.0)
Hemoglobin: 14 g/dL (ref 13.0–17.0)
LYMPHS ABS: 1.9 10*3/uL (ref 0.7–4.0)
Lymphocytes Relative: 35 % (ref 12–46)
MCH: 32.6 pg (ref 26.0–34.0)
MCHC: 33.3 g/dL (ref 30.0–36.0)
MCV: 97.7 fL (ref 78.0–100.0)
MONOS PCT: 10 % (ref 3–12)
MPV: 10.3 fL (ref 8.6–12.4)
Monocytes Absolute: 0.5 10*3/uL (ref 0.1–1.0)
NEUTROS ABS: 2.7 10*3/uL (ref 1.7–7.7)
Neutrophils Relative %: 50 % (ref 43–77)
Platelets: 225 10*3/uL (ref 150–400)
RBC: 4.3 MIL/uL (ref 4.22–5.81)
RDW: 12.2 % (ref 11.5–15.5)
WBC: 5.3 10*3/uL (ref 4.0–10.5)

## 2015-01-13 LAB — T-HELPER CELL (CD4) - (RCID CLINIC ONLY)
CD4 % Helper T Cell: 28 % — ABNORMAL LOW (ref 33–55)
CD4 T Cell Abs: 570 /uL (ref 400–2700)

## 2015-01-14 LAB — COMPREHENSIVE METABOLIC PANEL
ALT: 78 U/L — AB (ref 0–53)
AST: 52 U/L — AB (ref 0–37)
Albumin: 4.3 g/dL (ref 3.5–5.2)
Alkaline Phosphatase: 94 U/L (ref 39–117)
BILIRUBIN TOTAL: 0.4 mg/dL (ref 0.2–1.2)
BUN: 15 mg/dL (ref 6–23)
CALCIUM: 9.3 mg/dL (ref 8.4–10.5)
CHLORIDE: 108 meq/L (ref 96–112)
CO2: 24 meq/L (ref 19–32)
CREATININE: 1.08 mg/dL (ref 0.50–1.35)
Glucose, Bld: 93 mg/dL (ref 70–99)
Potassium: 4.5 mEq/L (ref 3.5–5.3)
SODIUM: 141 meq/L (ref 135–145)
Total Protein: 7.2 g/dL (ref 6.0–8.3)

## 2015-01-16 LAB — HIV-1 RNA QUANT-NO REFLEX-BLD: HIV-1 RNA Quant, Log: <1.3 {Log} (ref <1.30)

## 2015-01-16 LAB — HEPATITIS C ANTIBODY: HCV AB: NEGATIVE

## 2015-01-16 NOTE — Addendum Note (Signed)
Addended bySteva Colder: Keilany Burnette on: 01/16/2015 03:00 PM   Modules accepted: Orders

## 2015-01-16 NOTE — Addendum Note (Signed)
Addended by: Jennet MaduroESTRIDGE, Archer Vise D on: 01/16/2015 02:30 PM   Modules accepted: Orders

## 2015-02-28 ENCOUNTER — Other Ambulatory Visit: Payer: Self-pay | Admitting: *Deleted

## 2015-02-28 DIAGNOSIS — B20 Human immunodeficiency virus [HIV] disease: Secondary | ICD-10-CM

## 2015-02-28 MED ORDER — EFAVIRENZ-EMTRICITAB-TENOFOVIR 600-200-300 MG PO TABS: 1.0000 | ORAL_TABLET | Freq: Every day | ORAL | Status: DC

## 2015-03-01 ENCOUNTER — Ambulatory Visit (INDEPENDENT_AMBULATORY_CARE_PROVIDER_SITE_OTHER): Payer: Self-pay | Admitting: Infectious Diseases

## 2015-03-01 ENCOUNTER — Encounter: Payer: Self-pay | Admitting: Infectious Diseases

## 2015-03-01 VITALS — BP 124/82 | HR 101 | Temp 98.8°F | Ht 65.0 in | Wt 194.0 lb

## 2015-03-01 DIAGNOSIS — Z113 Encounter for screening for infections with a predominantly sexual mode of transmission: Secondary | ICD-10-CM

## 2015-03-01 DIAGNOSIS — Z23 Encounter for immunization: Secondary | ICD-10-CM

## 2015-03-01 DIAGNOSIS — R809 Proteinuria, unspecified: Secondary | ICD-10-CM

## 2015-03-01 DIAGNOSIS — B20 Human immunodeficiency virus [HIV] disease: Secondary | ICD-10-CM

## 2015-03-01 DIAGNOSIS — Z79899 Other long term (current) drug therapy: Secondary | ICD-10-CM

## 2015-03-01 DIAGNOSIS — K219 Gastro-esophageal reflux disease without esophagitis: Secondary | ICD-10-CM

## 2015-03-01 DIAGNOSIS — L259 Unspecified contact dermatitis, unspecified cause: Secondary | ICD-10-CM

## 2015-03-01 LAB — LIPID PANEL
Cholesterol: 154 mg/dL (ref 0–200)
HDL: 43 mg/dL (ref >=40)
LDL CALC: 85 mg/dL (ref 0–99)
Total CHOL/HDL Ratio: 3.6 Ratio
Triglycerides: 129 mg/dL (ref <150)
VLDL: 26 mg/dL (ref 0–40)

## 2015-03-01 LAB — HEPATITIS B SURFACE ANTIBODY,QUALITATIVE: Hep B S Ab: POSITIVE — AB

## 2015-03-01 MED ORDER — CETIRIZINE HCL 10 MG PO CHEW: 10.0000 mg | CHEWABLE_TABLET | Freq: Every day | ORAL | Status: DC

## 2015-03-01 MED ORDER — HYDROCORTISONE 1 % EX LOTN: 1.0000 "application " | TOPICAL_LOTION | Freq: Two times a day (BID) | CUTANEOUS | Status: DC

## 2015-03-01 NOTE — Addendum Note (Signed)
Addended by: Andree CossHOWELL, MICHELLE M on: 03/01/2015 10:52 AM   Modules accepted: Orders

## 2015-03-01 NOTE — Assessment & Plan Note (Signed)
Will have hm seen by GI for protonix failing, bloating.

## 2015-03-01 NOTE — Assessment & Plan Note (Signed)
He is doing very well.  Will continue his rx Given condoms.  Start hep a series Recheck Hep S Ab rtc 6 months

## 2015-03-01 NOTE — Assessment & Plan Note (Signed)
Will recheck his UA, gc, chlamydia, RPR i offered to have him seen by uro but her defers.

## 2015-03-01 NOTE — Progress Notes (Signed)
   Subjective:    Patient ID: Elijah LaymanFelton Guerra, male    DOB: 01/26/1971, 44 y.o.   MRN: [id number]  HPI [age] yo M [place]x of HIV+, [place] Also hx of GERD. Has been having less relief with protonix than previous. Feels like something is wrong with his digestive system, has been having bloating. States he had previous swallowing study that was normal. He also has been having hives on his belt line down his lower leg. No new soaps, detergents, shampoos. Has been taking benadryl which helps him sleep but does not relieve the itching.   Had recent work physical and was told he had "hematuria and proteinuria"  HIV 1 RNA QUANT (copies/mL)  Date Value  01/13/2015 <20  09/15/2014 <20  04/14/2014 <20   CD4 T CELL ABS (/uL)  Date Value  01/13/2015 570  09/15/2014 710  04/14/2014 700   Review of Systems  Constitutional: Negative for appetite change and unexpected weight change.  Genitourinary: Negative for difficulty urinating.  Skin: Positive for rash.      Objective:   Physical Exam  Constitutional: He appears well-developed and well-nourished.  HENT:  Mouth/Throat: No oropharyngeal exudate.  Eyes: EOM are normal. Pupils are equal, round, and reactive to light.  Neck: Neck supple.  Cardiovascular: Normal rate, regular rhythm and normal heart sounds.   Pulmonary/Chest: Effort normal and breath sounds normal.  Abdominal: Soft. Bowel sounds are normal. He exhibits no distension. There is no tenderness.  Lymphadenopathy:    He has no cervical adenopathy.  Skin:          Assessment & Plan:

## 2015-03-01 NOTE — Assessment & Plan Note (Signed)
Will write him a rx for HCT cream. Asked him to take non-sedating anti-histamine. If these do not work, will send to allergy.

## 2015-03-02 LAB — URINALYSIS, ROUTINE W REFLEX MICROSCOPIC
Bilirubin Urine: NEGATIVE
GLUCOSE, UA: NEGATIVE mg/dL
Ketones, ur: NEGATIVE mg/dL
LEUKOCYTES UA: NEGATIVE
Nitrite: NEGATIVE
PH: 6 (ref 5.0–8.0)
Protein, ur: 30 mg/dL — AB
Specific Gravity, Urine: 1.025 (ref 1.005–1.030)
Urobilinogen, UA: 0.2 mg/dL (ref 0.0–1.0)

## 2015-03-02 LAB — URINALYSIS, MICROSCOPIC ONLY
Bacteria, UA: NONE SEEN
CRYSTALS: NONE SEEN
Casts: NONE SEEN
SQUAMOUS EPITHELIAL / LPF: NONE SEEN

## 2015-03-02 LAB — URINE CYTOLOGY ANCILLARY ONLY
CHLAMYDIA, DNA PROBE: NEGATIVE
Neisseria Gonorrhea: NEGATIVE

## 2015-03-02 LAB — RPR

## 2015-03-23 ENCOUNTER — Other Ambulatory Visit: Payer: Self-pay | Admitting: Internal Medicine

## 2015-04-05 ENCOUNTER — Telehealth: Payer: Self-pay | Admitting: *Deleted

## 2015-04-05 NOTE — Telephone Encounter (Signed)
Omeprazole and lansoprazole are covered by ADAP.

## 2015-04-05 NOTE — Telephone Encounter (Signed)
Pt prescribed pantoprazole, not covered by plan.  Pharmacy requesting alternative medication be prescribed.  Pt is new to ADAP.   MD please advise.

## 2015-04-05 NOTE — Telephone Encounter (Signed)
Please find out what is on ADAP plan, thanks

## 2015-04-14 ENCOUNTER — Other Ambulatory Visit: Payer: Self-pay | Admitting: Licensed Clinical Social Worker

## 2015-04-14 ENCOUNTER — Telehealth: Payer: Self-pay | Admitting: Licensed Clinical Social Worker

## 2015-04-14 MED ORDER — OMEPRAZOLE 40 MG PO CPDR: 40.0000 mg | DELAYED_RELEASE_CAPSULE | Freq: Every day | ORAL | Status: DC

## 2015-04-14 NOTE — Telephone Encounter (Signed)
Perfect, thanks 

## 2015-04-14 NOTE — Telephone Encounter (Signed)
ADAP does not cover Pantoprazole but will cover Omeprazole is it ok to change it and [facility] [place]?

## 2015-06-28 ENCOUNTER — Ambulatory Visit: Payer: Self-pay

## 2015-06-28 ENCOUNTER — Telehealth: Payer: Self-pay | Admitting: *Deleted

## 2015-06-28 NOTE — Telephone Encounter (Signed)
Patient will come in today at 4:00 for ADAP renewal. He spoke with Con Memos, [facility] to confirm what he needs to bring.  He is not eligible for insurance through work until open enrollment this fall. Andree Coss, RN

## 2015-06-29 ENCOUNTER — Ambulatory Visit: Payer: Self-pay

## 2015-07-17 ENCOUNTER — Encounter (HOSPITAL_COMMUNITY): Payer: Self-pay

## 2015-07-17 ENCOUNTER — Emergency Department (HOSPITAL_COMMUNITY)
Admission: EM | Admit: 2015-07-17 | Discharge: 2015-07-18 | Disposition: A | Discharge status: Home / Self Care | Payer: 59 | Attending: Emergency Medicine | Admitting: Emergency Medicine

## 2015-07-17 DIAGNOSIS — Z87891 Personal history of nicotine dependence: Secondary | ICD-10-CM | POA: Diagnosis not present

## 2015-07-17 DIAGNOSIS — L259 Unspecified contact dermatitis, unspecified cause: Secondary | ICD-10-CM | POA: Diagnosis not present

## 2015-07-17 DIAGNOSIS — Z21 Asymptomatic human immunodeficiency virus [HIV] infection status: Secondary | ICD-10-CM | POA: Insufficient documentation

## 2015-07-17 DIAGNOSIS — Z79899 Other long term (current) drug therapy: Secondary | ICD-10-CM | POA: Diagnosis not present

## 2015-07-17 DIAGNOSIS — L309 Dermatitis, unspecified: Secondary | ICD-10-CM

## 2015-07-17 DIAGNOSIS — R21 Rash and other nonspecific skin eruption: Secondary | ICD-10-CM | POA: Diagnosis present

## 2015-07-17 MED ORDER — PREDNISONE 20 MG PO TABS
60.0000 mg | ORAL_TABLET | Freq: Once | ORAL | Status: AC
  Administered 2015-07-18: 60 mg via ORAL
  Filled 2015-07-17: qty 3

## 2015-07-17 MED ORDER — FAMOTIDINE 20 MG PO TABS
20.0000 mg | ORAL_TABLET | Freq: Once | ORAL | Status: AC
  Administered 2015-07-18: 20 mg via ORAL
  Filled 2015-07-17: qty 1

## 2015-07-17 NOTE — ED Provider Notes (Signed)
CSN: [id number]     Arrival date & time 07/17/15  1813 History  This chart was scribed for Matvey Llanas, MD by Lyndel Safe, ED Scribe. This patient was seen in room WA03/WA03 and the patient's care was started 11:40 PM.    Chief Complaint  Patient presents with  . Allergic Reaction    Patient is a 44 y.o. male presenting with allergic reaction. The history is provided by the patient. No language interpreter was used.  Allergic Reaction Presenting symptoms: rash   Presenting symptoms: no difficulty breathing, no difficulty swallowing and no swelling   Severity:  Moderate Prior episodes: prior similar episodes. Context: no animal exposure, no cosmetics and no new detergents/soaps   Relieved by:  Nothing Worsened by:  Nothing tried Ineffective treatments:  Antihistamines and OTC ointments (zyrtec)  HPI Comments: Elijah Guerra is a 44 y.o. male, with a PMhx of HIV, who presents to the Emergency Department complaining of an intermittent, moderate rash to groin area that has been present for several weeks but re-appeared 3 days ago. He has taken benadryl, zyrtec and used cortisone 10 with no relief of his symptoms. Pt states the rash is worse in the evening and improves in the morning, subsiding throughout the day. He notes past similar episodes with an unknown cause. Pt has been using Free and Clear cleaning products and withdrawn environmental triggers. Denies SOB, throat swelling, or trouble swallowing.   Past Medical History  Diagnosis Date  . HIV (human immunodeficiency virus infection)    History reviewed. No pertinent past surgical history. History reviewed. No pertinent family history. Social History  Substance Use Topics  . Smoking status: Former Smoker -- 0.30 packs/day    Types: Cigars  . Smokeless tobacco: Never Used     Comment: congratulated!!  . Alcohol Use: No    Review of Systems  Constitutional: Negative for fever.  HENT: Negative for drooling, facial swelling  and trouble swallowing.   Respiratory: Negative for shortness of breath.   Skin: Positive for rash.  All other systems reviewed and are negative.  Allergies  Review of patient's allergies indicates no known allergies.  Home Medications   Prior to Admission medications   Medication Sig Start Date End Date Taking? Authorizing Provider  ATRIPLA 600-200-300 MG per tablet TAKE 1 TABLET BY MOUTH AT BEDTIME 03/24/15  Yes Ginnie Smart, MD  omeprazole (PRILOSEC) 40 MG capsule Take 1 capsule (40 mg total) by mouth daily. 04/14/15  Yes Ginnie Smart, MD  cetirizine (ZYRTEC) 10 MG chewable tablet Chew 1 tablet (10 mg total) by mouth daily. Patient not taking: Reported on 07/17/2015 03/01/15   Ginnie Smart, MD  efavirenz-emtricitabine-tenofovir (ATRIPLA) 600-200-300 MG per tablet Take 1 tablet by mouth at bedtime. Patient not taking: Reported on 07/17/2015 02/28/15   Gardiner Barefoot, MD  hydrocortisone 1 % lotion Apply 1 application topically 2 (two) times daily. Patient not taking: Reported on 07/17/2015 03/01/15   Ginnie Smart, MD  VIAGRA 25 MG tablet TAKE 1 TABLET BY MOUTH DAILY AS NEEDED FOR ERECTILE DYSFUNCTION Patient not taking: Reported on 07/17/2015 09/16/14   Ginnie Smart, MD   BP 120/82 mmHg  Pulse 83  Temp(Src) 98.3 F (36.8 C) (Oral)  Resp 18  SpO2 98% Physical Exam  Constitutional: He appears well-developed and well-nourished. No distress.  HENT:  Head: Normocephalic and atraumatic.  Right Ear: External ear normal.  Left Ear: External ear normal.  Mouth/Throat: Oropharynx is clear and moist. No oropharyngeal exudate.  No swelling of the lips, tongue, or uvula. Mallempati class 1  Eyes: Conjunctivae and EOM are normal. Pupils are equal, round, and reactive to light. Right eye exhibits no discharge. Left eye exhibits no discharge. No scleral icterus.  Neck: Normal range of motion. Neck supple. No JVD present.  No strider. Trachea midline.   Cardiovascular: Normal  rate, regular rhythm and normal heart sounds.   Pulmonary/Chest: Effort normal and breath sounds normal. No respiratory distress. He has no wheezes. He has no rales.  Abdominal: Soft. Bowel sounds are normal. He exhibits no mass. There is no tenderness. There is no rebound and no guarding.  Musculoskeletal: Normal range of motion.  Neurological: He is alert. He has normal reflexes. Coordination normal.  Skin: Skin is warm and dry. No rash noted. No erythema. No pallor.  No lesions.   Psychiatric: He has a normal mood and affect. His behavior is normal.  Nursing note and vitals reviewed.   ED Course  Procedures  DIAGNOSTIC STUDIES: Oxygen Saturation is 98% on RA, normal by my interpretation.    COORDINATION OF CARE: 11:43 PM Discussed treatment plan which includes to order prednisone and pepsid with pt. Will give referral for allergist. Pt acknowledges and agrees to plan.    MDM   Final diagnoses:  None    Prednisone, pepcid and follow up with your PMD and allergist for ongoing testing and treatment. Strict return precautions  I personally performed the services described in this documentation, which was scribed in my presence. The recorded information has been reviewed and is accurate.     Cy Blamer, MD 07/18/15 0020

## 2015-07-17 NOTE — ED Notes (Signed)
Pt with allergic reaction hives from above knee to neck.  Started having reaction on Thurs/friday. Have had similar reaction before but unsure of cause.  Has changed soaps and things before.  No difficulty swallowing.  Has dry mouth.  Has tried benadryl and cortisone 10 but it did not help.

## 2015-07-18 MED ORDER — FAMOTIDINE 20 MG PO TABS: 20.0000 mg | ORAL_TABLET | Freq: Two times a day (BID) | ORAL | Status: DC

## 2015-07-18 MED ORDER — PREDNISONE 20 MG PO TABS: ORAL_TABLET | ORAL | Status: DC

## 2015-07-18 NOTE — Discharge Instructions (Signed)

## 2015-08-21 ENCOUNTER — Other Ambulatory Visit (INDEPENDENT_AMBULATORY_CARE_PROVIDER_SITE_OTHER): Payer: 59

## 2015-08-21 DIAGNOSIS — B20 Human immunodeficiency virus [HIV] disease: Secondary | ICD-10-CM | POA: Diagnosis not present

## 2015-08-21 LAB — CBC
HCT: 38.7 % — ABNORMAL LOW (ref 39.0–52.0)
HEMOGLOBIN: 13.1 g/dL (ref 13.0–17.0)
MCH: 31.7 pg (ref 26.0–34.0)
MCHC: 33.9 g/dL (ref 30.0–36.0)
MCV: 93.7 fL (ref 78.0–100.0)
MPV: 9.8 fL (ref 8.6–12.4)
PLATELETS: 261 10*3/uL (ref 150–400)
RBC: 4.13 MIL/uL — AB (ref 4.22–5.81)
RDW: 12.5 % (ref 11.5–15.5)
WBC: 4.9 10*3/uL (ref 4.0–10.5)

## 2015-08-21 LAB — COMPREHENSIVE METABOLIC PANEL
ALBUMIN: 4.2 g/dL (ref 3.6–5.1)
ALT: 54 U/L — ABNORMAL HIGH (ref 9–46)
AST: 46 U/L — ABNORMAL HIGH (ref 10–40)
Alkaline Phosphatase: 93 U/L (ref 40–115)
BUN: 15 mg/dL (ref 7–25)
CHLORIDE: 108 mmol/L (ref 98–110)
CO2: 24 mmol/L (ref 20–31)
Calcium: 8.9 mg/dL (ref 8.6–10.3)
Creat: 1.13 mg/dL (ref 0.60–1.35)
Glucose, Bld: 94 mg/dL (ref 65–99)
POTASSIUM: 3.8 mmol/L (ref 3.5–5.3)
Sodium: 141 mmol/L (ref 135–146)
TOTAL PROTEIN: 6.9 g/dL (ref 6.1–8.1)
Total Bilirubin: 0.4 mg/dL (ref 0.2–1.2)

## 2015-08-22 ENCOUNTER — Other Ambulatory Visit: Payer: Self-pay | Admitting: Infectious Diseases

## 2015-08-22 DIAGNOSIS — B20 Human immunodeficiency virus [HIV] disease: Secondary | ICD-10-CM

## 2015-08-22 LAB — T-HELPER CELL (CD4) - (RCID CLINIC ONLY)
CD4 T CELL HELPER: 29 % — AB (ref 33–55)
CD4 T Cell Abs: 670 /uL (ref 400–2700)

## 2015-08-22 MED ORDER — EFAVIRENZ-EMTRICITAB-TENOFOVIR 600-200-300 MG PO TABS: 1.0000 | ORAL_TABLET | Freq: Every day | ORAL | Status: DC

## 2015-08-23 LAB — HIV-1 RNA QUANT-NO REFLEX-BLD: HIV-1 RNA Quant, Log: <1.3 {Log} (ref <1.30)

## 2015-09-04 ENCOUNTER — Ambulatory Visit (INDEPENDENT_AMBULATORY_CARE_PROVIDER_SITE_OTHER): Payer: 59 | Admitting: Infectious Diseases

## 2015-09-04 ENCOUNTER — Encounter: Payer: Self-pay | Admitting: Infectious Diseases

## 2015-09-04 VITALS — BP 113/77 | HR 94 | Temp 97.1°F | Wt 197.0 lb

## 2015-09-04 DIAGNOSIS — R229 Localized swelling, mass and lump, unspecified: Secondary | ICD-10-CM

## 2015-09-04 DIAGNOSIS — L509 Urticaria, unspecified: Secondary | ICD-10-CM | POA: Diagnosis not present

## 2015-09-04 DIAGNOSIS — B20 Human immunodeficiency virus [HIV] disease: Secondary | ICD-10-CM

## 2015-09-04 DIAGNOSIS — Z23 Encounter for immunization: Secondary | ICD-10-CM

## 2015-09-04 DIAGNOSIS — K219 Gastro-esophageal reflux disease without esophagitis: Secondary | ICD-10-CM

## 2015-09-04 LAB — ACUTE HEP PANEL AND HEP B SURFACE AB
HCV AB: NEGATIVE
HEP B C IGM: NONREACTIVE
Hep A IgM: NONREACTIVE
Hep B S Ab: POSITIVE — AB
Hepatitis B Surface Ag: NEGATIVE

## 2015-09-04 MED ORDER — ELVITEG-COBIC-EMTRICIT-TENOFAF 150-150-200-10 MG PO TABS: 1.0000 | ORAL_TABLET | Freq: Every day | ORAL | Status: DC

## 2015-09-04 MED ORDER — EMTRICITAB-RILPIVIR-TENOFOV AF 200-25-25 MG PO TABS: 1.0000 | ORAL_TABLET | Freq: Every day | ORAL | Status: DC

## 2015-09-04 NOTE — Assessment & Plan Note (Signed)
Have him seen by GI now that he has insurance

## 2015-09-04 NOTE — Assessment & Plan Note (Signed)
Will have him seen by [facility]

## 2015-09-04 NOTE — Progress Notes (Signed)
   Subjective:    Patient ID: Elijah Guerra, male    DOB: 18-Apr-1971, 44 y.o.   MRN: [id number]  HPI 44 yo M with hx of HIV+, on [place]. Also hx of GERD. He was not able to go to GI due to lack of insurance. He has insurance now and would like to be rescheduled.  He has seen allergy- has labs that he needs to have done. Still having hives.   HIV 1 RNA QUANT (copies/mL)  Date Value  08/21/2015 <20  01/13/2015 <20  09/15/2014 <20   CD4 T CELL ABS (/uL)  Date Value  08/21/2015 670  01/13/2015 570  09/15/2014 710    Review of Systems  Constitutional: Negative for appetite change and unexpected weight change.  Respiratory: Negative for shortness of breath.   Gastrointestinal: Negative for diarrhea and constipation.  Genitourinary: Negative for difficulty urinating.  Skin: Positive for rash.       Objective:   Physical Exam  Constitutional: He appears well-developed and well-nourished.  HENT:  Mouth/Throat: No oropharyngeal exudate.  Eyes: EOM are normal. Pupils are equal, round, and reactive to light.  Neck: Neck supple.  Cardiovascular: Normal rate, regular rhythm and normal heart sounds.   Pulmonary/Chest: Effort normal and breath sounds normal.  Abdominal: Soft. Bowel sounds are normal. There is no tenderness. There is no rebound.  Lymphadenopathy:    He has no cervical adenopathy.  Skin:          Assessment & Plan:

## 2015-09-04 NOTE — Assessment & Plan Note (Signed)
Will change his art to [employer] flu shot.  rtc in 3 months.

## 2015-09-05 LAB — ANA REFLEX TO ENA 9PANEL: ANA: NEGATIVE

## 2015-09-05 LAB — ANTI-SMOOTH MUSCLE ANTIBODY, IGG: Smooth Muscle Ab: 7 U (ref <20)

## 2015-09-08 LAB — ANTI-MICROSOMAL ANTIBODY LIVER / KIDNEY

## 2015-09-14 ENCOUNTER — Other Ambulatory Visit: Payer: Self-pay | Admitting: *Deleted

## 2015-09-14 ENCOUNTER — Telehealth: Payer: Self-pay | Admitting: *Deleted

## 2015-09-14 DIAGNOSIS — B20 Human immunodeficiency virus [HIV] disease: Secondary | ICD-10-CM

## 2015-09-14 MED ORDER — ELVITEG-COBIC-EMTRICIT-TENOFAF 150-150-200-10 MG PO TABS: 1.0000 | ORAL_TABLET | Freq: Every day | ORAL | Status: DC

## 2015-09-14 NOTE — Telephone Encounter (Signed)
Prior Authorization initiated for [employer] through [employer], reference # [id number]. It was submitted for clinical review and this clinic will be notified by fax of the decision. The patient will also be notified by [facility]. Wendall MolaJacqueline Tecumseh Yeagley

## 2015-09-18 ENCOUNTER — Telehealth: Payer: Self-pay | Admitting: *Deleted

## 2015-09-18 NOTE — Telephone Encounter (Signed)
Sent Second PA to [employer] this morning.  No response from previous PA per [facility].

## 2015-09-21 ENCOUNTER — Telehealth: Payer: Self-pay | Admitting: *Deleted

## 2015-09-21 NOTE — Telephone Encounter (Signed)
Pt received call that Genvoya was not approved by [facility], criteria not met.  Pt has small supply of Atripla remaining.  Change patient back to Atripla?  MD please advise.  Criteria information placed in MD box.

## 2015-09-22 ENCOUNTER — Other Ambulatory Visit: Payer: Self-pay | Admitting: *Deleted

## 2015-09-22 MED ORDER — EFAVIRENZ-EMTRICITAB-TENOFOVIR 600-200-300 MG PO TABS: 1.0000 | ORAL_TABLET | Freq: Every day | ORAL | Status: DC

## 2015-09-22 NOTE — Telephone Encounter (Signed)
[facility] per [facility] request. Wendall MolaJacqueline Cockerham

## 2015-09-22 NOTE — Telephone Encounter (Signed)
Continue atripla thanks

## 2015-10-11 ENCOUNTER — Telehealth: Payer: Self-pay

## 2015-10-11 NOTE — Telephone Encounter (Signed)
[facility] Pharmacist calling to say she will assist the patient with [employer] script. She will call with the patient on the line to assure the process is completed and patient receives medication.    Laurell Josephsammy  K Kaaren Nass, RN

## 2015-10-17 NOTE — Progress Notes (Signed)
Notified [facility] via fax. Zaidyn Claire M, RN 

## 2015-10-25 ENCOUNTER — Other Ambulatory Visit: Payer: Self-pay | Admitting: *Deleted

## 2015-10-25 DIAGNOSIS — B20 Human immunodeficiency virus [HIV] disease: Secondary | ICD-10-CM

## 2015-10-25 MED ORDER — EFAVIRENZ-EMTRICITAB-TENOFOVIR 600-200-300 MG PO TABS: 1.0000 | ORAL_TABLET | Freq: Every day | ORAL | Status: DC

## 2015-10-25 NOTE — Telephone Encounter (Signed)
Pt now has [employer] insurance.

## 2015-11-13 ENCOUNTER — Other Ambulatory Visit: Payer: 59

## 2015-12-04 ENCOUNTER — Ambulatory Visit: Payer: 59 | Admitting: Infectious Diseases

## 2016-02-24 ENCOUNTER — Other Ambulatory Visit: Payer: Self-pay | Admitting: Infectious Diseases

## 2016-09-04 ENCOUNTER — Other Ambulatory Visit: Payer: Self-pay | Admitting: Infectious Diseases

## 2016-09-04 DIAGNOSIS — B20 Human immunodeficiency virus [HIV] disease: Secondary | ICD-10-CM

## 2016-09-06 ENCOUNTER — Ambulatory Visit: Payer: 59 | Admitting: Infectious Diseases

## 2016-09-12 ENCOUNTER — Other Ambulatory Visit: Payer: 59

## 2016-10-02 ENCOUNTER — Other Ambulatory Visit: Payer: BLUE CROSS/BLUE SHIELD

## 2016-10-02 DIAGNOSIS — B20 Human immunodeficiency virus [HIV] disease: Secondary | ICD-10-CM

## 2016-10-02 DIAGNOSIS — Z113 Encounter for screening for infections with a predominantly sexual mode of transmission: Secondary | ICD-10-CM

## 2016-10-02 DIAGNOSIS — Z79899 Other long term (current) drug therapy: Secondary | ICD-10-CM

## 2016-10-02 LAB — LIPID PANEL
CHOL/HDL RATIO: 3.7 ratio (ref <5.0)
Cholesterol: 147 mg/dL (ref <200)
HDL: 40 mg/dL — AB (ref >40)
LDL Cholesterol: 68 mg/dL
Triglycerides: 193 mg/dL — ABNORMAL HIGH (ref <150)
VLDL: 39 mg/dL — AB (ref <30)

## 2016-10-02 LAB — COMPLETE METABOLIC PANEL WITH GFR
ALT: 72 U/L — AB (ref 9–46)
AST: 62 U/L — AB (ref 10–40)
Albumin: 4.4 g/dL (ref 3.6–5.1)
Alkaline Phosphatase: 99 U/L (ref 40–115)
BUN: 13 mg/dL (ref 7–25)
CHLORIDE: 105 mmol/L (ref 98–110)
CO2: 23 mmol/L (ref 20–31)
CREATININE: 1.14 mg/dL (ref 0.60–1.35)
Calcium: 9.2 mg/dL (ref 8.6–10.3)
GFR, EST AFRICAN AMERICAN: 89 mL/min (ref >=60)
GFR, Est Non African American: 77 mL/min (ref >=60)
GLUCOSE: 104 mg/dL — AB (ref 65–99)
Potassium: 4 mmol/L (ref 3.5–5.3)
SODIUM: 140 mmol/L (ref 135–146)
Total Bilirubin: 0.4 mg/dL (ref 0.2–1.2)
Total Protein: 7.4 g/dL (ref 6.1–8.1)

## 2016-10-02 LAB — CBC
HCT: 42.4 % (ref 38.5–50.0)
Hemoglobin: 14.1 g/dL (ref 13.2–17.1)
MCH: 31.5 pg (ref 27.0–33.0)
MCHC: 33.3 g/dL (ref 32.0–36.0)
MCV: 94.6 fL (ref 80.0–100.0)
MPV: 9.6 fL (ref 7.5–12.5)
PLATELETS: 258 10*3/uL (ref 140–400)
RBC: 4.48 MIL/uL (ref 4.20–5.80)
RDW: 12.4 % (ref 11.0–15.0)
WBC: 4.6 10*3/uL (ref 3.8–10.8)

## 2016-10-03 LAB — RPR

## 2016-10-03 LAB — T-HELPER CELL (CD4) - (RCID CLINIC ONLY)
CD4 % Helper T Cell: 29 % — ABNORMAL LOW (ref 33–55)
CD4 T Cell Abs: 630 /uL (ref 400–2700)

## 2016-10-04 LAB — HIV-1 RNA QUANT-NO REFLEX-BLD: HIV 1 RNA Quant: <20 copies/mL (ref <20)

## 2016-10-10 ENCOUNTER — Ambulatory Visit (INDEPENDENT_AMBULATORY_CARE_PROVIDER_SITE_OTHER): Payer: 59 | Admitting: Infectious Diseases

## 2016-10-10 ENCOUNTER — Encounter: Payer: Self-pay | Admitting: Infectious Diseases

## 2016-10-10 VITALS — BP 116/81 | HR 77 | Temp 98.1°F | Ht 65.0 in | Wt 190.0 lb

## 2016-10-10 DIAGNOSIS — L239 Allergic contact dermatitis, unspecified cause: Secondary | ICD-10-CM | POA: Diagnosis not present

## 2016-10-10 DIAGNOSIS — Z23 Encounter for immunization: Secondary | ICD-10-CM | POA: Diagnosis not present

## 2016-10-10 DIAGNOSIS — Z113 Encounter for screening for infections with a predominantly sexual mode of transmission: Secondary | ICD-10-CM

## 2016-10-10 DIAGNOSIS — B20 Human immunodeficiency virus [HIV] disease: Secondary | ICD-10-CM

## 2016-10-10 DIAGNOSIS — Z79899 Other long term (current) drug therapy: Secondary | ICD-10-CM | POA: Diagnosis not present

## 2016-10-10 MED ORDER — ELVITEG-COBIC-EMTRICIT-TENOFAF 150-150-200-10 MG PO TABS: 1.0000 | ORAL_TABLET | Freq: Every day | ORAL | 3 refills | Status: DC

## 2016-10-10 NOTE — Addendum Note (Signed)
Addended by: Andree CossHOWELL, Dailynn Nancarrow M on: 10/10/2016 05:37 PM   Modules accepted: Orders

## 2016-10-10 NOTE — Assessment & Plan Note (Signed)
Resolved.  Will continue to watch.

## 2016-10-10 NOTE — Progress Notes (Signed)
   Subjective:    Patient ID: Elijah Guerra, male    DOB: 05/10/1971, 45 y.o.   MRN: [id number]  HPI 45 yo M with [place]x of [place]+, [place]. Also hx of GERD.Taking OTC prilosec. Quit eating red meat.  Hives have resolved.  Has been exercising more. Wt has been stable.  Works as Naval architecttruck driver.   Review of Systems  Constitutional: Negative for appetite change and unexpected weight change.  Gastrointestinal: Positive for constipation. Negative for diarrhea.  Genitourinary: Negative for difficulty urinating.  Psychiatric/Behavioral: Negative for sleep disturbance.       Objective:   Physical Exam  Constitutional: He appears well-developed and well-nourished.  HENT:  Mouth/Throat: No oropharyngeal exudate.  Eyes: EOM are normal. Pupils are equal, round, and reactive to light.  Neck: Neck supple.  Cardiovascular: Normal rate, regular rhythm and normal heart sounds.   Pulmonary/Chest: Effort normal and breath sounds normal.  Abdominal: Soft. Bowel sounds are normal. There is no tenderness. There is no rebound.  Musculoskeletal: He exhibits no edema.  Lymphadenopathy:    He has no cervical adenopathy.      Assessment & Plan:

## 2016-10-10 NOTE — Assessment & Plan Note (Signed)
He is doing well.  Will try again to switch him to genvoya.  Offered/refused condoms.  Will see him back in 4 months Flu and menig vax today.   HIV 1 RNA Quant (copies/mL)  Date Value  10/02/2016 <20  08/21/2015 <20  01/13/2015 <20   CD4 T Cell Abs (/uL)  Date Value  10/02/2016 630  08/21/2015 670  01/13/2015 570

## 2017-01-08 ENCOUNTER — Other Ambulatory Visit: Payer: BLUE CROSS/BLUE SHIELD

## 2017-01-22 ENCOUNTER — Ambulatory Visit (INDEPENDENT_AMBULATORY_CARE_PROVIDER_SITE_OTHER): Payer: BLUE CROSS/BLUE SHIELD | Admitting: Infectious Diseases

## 2017-01-22 ENCOUNTER — Encounter: Payer: Self-pay | Admitting: Infectious Diseases

## 2017-01-22 ENCOUNTER — Other Ambulatory Visit (HOSPITAL_COMMUNITY)
Admission: RE | Admit: 2017-01-22 | Discharge: 2017-01-22 | Disposition: A | Discharge status: Home / Self Care | Payer: BLUE CROSS/BLUE SHIELD | Source: Ambulatory Visit | Attending: Infectious Diseases | Admitting: Infectious Diseases

## 2017-01-22 VITALS — BP 129/82 | HR 78 | Temp 98.3°F | Ht 65.0 in | Wt 199.0 lb

## 2017-01-22 DIAGNOSIS — K5900 Constipation, unspecified: Secondary | ICD-10-CM

## 2017-01-22 DIAGNOSIS — Z113 Encounter for screening for infections with a predominantly sexual mode of transmission: Secondary | ICD-10-CM

## 2017-01-22 DIAGNOSIS — Z789 Other specified health status: Secondary | ICD-10-CM | POA: Diagnosis not present

## 2017-01-22 DIAGNOSIS — B20 Human immunodeficiency virus [HIV] disease: Secondary | ICD-10-CM

## 2017-01-22 DIAGNOSIS — Z79899 Other long term (current) drug therapy: Secondary | ICD-10-CM | POA: Diagnosis not present

## 2017-01-22 DIAGNOSIS — F17200 Nicotine dependence, unspecified, uncomplicated: Secondary | ICD-10-CM

## 2017-01-22 DIAGNOSIS — K219 Gastro-esophageal reflux disease without esophagitis: Secondary | ICD-10-CM | POA: Diagnosis not present

## 2017-01-22 LAB — CBC
HCT: 45.9 % (ref 38.5–50.0)
HEMOGLOBIN: 15.1 g/dL (ref 13.2–17.1)
MCH: 32 pg (ref 27.0–33.0)
MCHC: 32.9 g/dL (ref 32.0–36.0)
MCV: 97.2 fL (ref 80.0–100.0)
MPV: 10.5 fL (ref 7.5–12.5)
PLATELETS: 241 10*3/uL (ref 140–400)
RBC: 4.72 MIL/uL (ref 4.20–5.80)
RDW: 12.4 % (ref 11.0–15.0)
WBC: 7.4 10*3/uL (ref 3.8–10.8)

## 2017-01-22 NOTE — Assessment & Plan Note (Signed)
He is doing well Refuses vax today Will check his labs today.  Offered/refused condoms.  rtc in 6 months.

## 2017-01-22 NOTE — Assessment & Plan Note (Signed)
counseled to quit, smokes "every blue moon"

## 2017-01-22 NOTE — Assessment & Plan Note (Addendum)
Suggested colace, he wants "the strongest thing".  Try Mg Citrate and 2 colace. otc

## 2017-01-22 NOTE — Progress Notes (Signed)
   Subjective:    Patient ID: Elijah Guerra, male    DOB: 11/29/1970, 46 y.o.   MRN: [id number]  HPI 46 yo M with hx of HIV+, [place]. Also hx of GERD.Taking OTC prilosec. He was changed to genvoya 09-2016.   HIV 1 RNA Quant (copies/mL)  Date Value  10/02/2016 <20  08/21/2015 <20  01/13/2015 <20   CD4 T Cell Abs (/uL)  Date Value  10/02/2016 630  08/21/2015 670  01/13/2015 570   Missed his lab appt.  Has noted no difference in between [place].  Takes occas prilosec for GERD.  Exercising less, working more. Baseline wt 176. Taking prn metamucil. Works as [employer] architecttruck driver.   Review of Systems  Constitutional: Negative for appetite change and unexpected weight change.  Respiratory: Negative for shortness of breath.   Gastrointestinal: Positive for constipation. Negative for diarrhea.  Genitourinary: Negative for difficulty urinating.  Neurological: Negative for headaches.  Please see HPI. 12 point ROS o/w (-)      Objective:   Physical Exam  Constitutional: He appears well-developed and well-nourished.  HENT:  Mouth/Throat: No oropharyngeal exudate.  Eyes: EOM are normal. Pupils are equal, round, and reactive to light.  Neck: Neck supple.  Cardiovascular: Normal rate, regular rhythm and normal heart sounds.   Pulmonary/Chest: Effort normal and breath sounds normal.  Abdominal: Soft. Bowel sounds are normal. There is no tenderness. There is no rebound.  Musculoskeletal: He exhibits no edema.  Lymphadenopathy:    He has no cervical adenopathy.      Assessment & Plan:

## 2017-01-22 NOTE — Assessment & Plan Note (Signed)
Cont prn ppi

## 2017-01-23 LAB — COMPREHENSIVE METABOLIC PANEL
ALBUMIN: 4.5 g/dL (ref 3.6–5.1)
ALT: 42 U/L (ref 9–46)
AST: 31 U/L (ref 10–40)
Alkaline Phosphatase: 75 U/L (ref 40–115)
BILIRUBIN TOTAL: 0.7 mg/dL (ref 0.2–1.2)
BUN: 12 mg/dL (ref 7–25)
CHLORIDE: 104 mmol/L (ref 98–110)
CO2: 21 mmol/L (ref 20–31)
CREATININE: 1.42 mg/dL — AB (ref 0.60–1.35)
Calcium: 9.8 mg/dL (ref 8.6–10.3)
GLUCOSE: 83 mg/dL (ref 65–99)
Potassium: 4.4 mmol/L (ref 3.5–5.3)
SODIUM: 141 mmol/L (ref 135–146)
Total Protein: 7.7 g/dL (ref 6.1–8.1)

## 2017-01-23 LAB — LIPID PANEL
Cholesterol: 178 mg/dL (ref <200)
HDL: 43 mg/dL (ref >40)
LDL CALC: 105 mg/dL — AB (ref <100)
TRIGLYCERIDES: 149 mg/dL (ref <150)
Total CHOL/HDL Ratio: 4.1 Ratio (ref <5.0)
VLDL: 30 mg/dL (ref <30)

## 2017-01-23 LAB — RPR

## 2017-01-24 LAB — T-HELPER CELL (CD4) - (RCID CLINIC ONLY)
CD4 T CELL ABS: 1090 /uL (ref 400–2700)
CD4 T CELL HELPER: 28 % — AB (ref 33–55)

## 2017-01-24 LAB — HIV-1 RNA QUANT-NO REFLEX-BLD
HIV 1 RNA Quant: <20 copies/mL
HIV-1 RNA Quant, Log: <1.3 Log copies/mL

## 2017-01-24 LAB — URINE CYTOLOGY ANCILLARY ONLY
CHLAMYDIA, DNA PROBE: NEGATIVE
NEISSERIA GONORRHEA: NEGATIVE

## 2017-03-05 DIAGNOSIS — L309 Dermatitis, unspecified: Secondary | ICD-10-CM | POA: Diagnosis not present

## 2017-03-05 DIAGNOSIS — L438 Other lichen planus: Secondary | ICD-10-CM | POA: Diagnosis not present

## 2017-03-05 DIAGNOSIS — L259 Unspecified contact dermatitis, unspecified cause: Secondary | ICD-10-CM | POA: Diagnosis not present

## 2017-03-06 DIAGNOSIS — L309 Dermatitis, unspecified: Secondary | ICD-10-CM | POA: Diagnosis not present

## 2017-09-03 DIAGNOSIS — L309 Dermatitis, unspecified: Secondary | ICD-10-CM | POA: Diagnosis not present

## 2017-09-03 DIAGNOSIS — L439 Lichen planus, unspecified: Secondary | ICD-10-CM | POA: Diagnosis not present

## 2017-09-03 DIAGNOSIS — L218 Other seborrheic dermatitis: Secondary | ICD-10-CM | POA: Diagnosis not present

## 2017-09-04 ENCOUNTER — Other Ambulatory Visit: Payer: BLUE CROSS/BLUE SHIELD

## 2017-09-08 ENCOUNTER — Other Ambulatory Visit: Payer: BLUE CROSS/BLUE SHIELD

## 2017-09-08 ENCOUNTER — Other Ambulatory Visit (HOSPITAL_COMMUNITY)
Admission: RE | Admit: 2017-09-08 | Discharge: 2017-09-08 | Disposition: A | Discharge status: Home / Self Care | Payer: BLUE CROSS/BLUE SHIELD | Source: Ambulatory Visit | Attending: Infectious Diseases | Admitting: Infectious Diseases

## 2017-09-08 DIAGNOSIS — Z79899 Other long term (current) drug therapy: Secondary | ICD-10-CM

## 2017-09-08 DIAGNOSIS — Z113 Encounter for screening for infections with a predominantly sexual mode of transmission: Secondary | ICD-10-CM

## 2017-09-08 DIAGNOSIS — B20 Human immunodeficiency virus [HIV] disease: Secondary | ICD-10-CM | POA: Diagnosis not present

## 2017-09-09 LAB — LIPID PANEL
CHOLESTEROL: 165 mg/dL (ref <200)
HDL: 44 mg/dL (ref >40)
LDL Cholesterol (Calc): 101 mg/dL (calc) — ABNORMAL HIGH
Non-HDL Cholesterol (Calc): 121 mg/dL (calc) (ref <130)
Total CHOL/HDL Ratio: 3.8 (calc) (ref <5.0)
Triglycerides: 103 mg/dL (ref <150)

## 2017-09-09 LAB — COMPREHENSIVE METABOLIC PANEL
AG RATIO: 1.5 (calc) (ref 1.0–2.5)
ALBUMIN MSPROF: 4.7 g/dL (ref 3.6–5.1)
ALKALINE PHOSPHATASE (APISO): 68 U/L (ref 40–115)
ALT: 30 U/L (ref 9–46)
AST: 26 U/L (ref 10–40)
BUN: 18 mg/dL (ref 7–25)
CALCIUM: 9.5 mg/dL (ref 8.6–10.3)
CO2: 26 mmol/L (ref 20–32)
Chloride: 107 mmol/L (ref 98–110)
Creat: 1.3 mg/dL (ref 0.60–1.35)
Globulin: 3.1 g/dL (calc) (ref 1.9–3.7)
Glucose, Bld: 90 mg/dL (ref 65–99)
POTASSIUM: 4 mmol/L (ref 3.5–5.3)
SODIUM: 143 mmol/L (ref 135–146)
TOTAL PROTEIN: 7.8 g/dL (ref 6.1–8.1)
Total Bilirubin: 0.8 mg/dL (ref 0.2–1.2)

## 2017-09-09 LAB — T-HELPER CELL (CD4) - (RCID CLINIC ONLY)
CD4 % Helper T Cell: 26 % — ABNORMAL LOW (ref 33–55)
CD4 T CELL ABS: 760 /uL (ref 400–2700)

## 2017-09-09 LAB — URINE CYTOLOGY ANCILLARY ONLY
CHLAMYDIA, DNA PROBE: NEGATIVE
NEISSERIA GONORRHEA: NEGATIVE

## 2017-09-09 LAB — CBC
HEMATOCRIT: 45.6 % (ref 38.5–50.0)
Hemoglobin: 15.5 g/dL (ref 13.2–17.1)
MCH: 32.4 pg (ref 27.0–33.0)
MCHC: 34 g/dL (ref 32.0–36.0)
MCV: 95.2 fL (ref 80.0–100.0)
MPV: 11.1 fL (ref 7.5–12.5)
Platelets: 203 10*3/uL (ref 140–400)
RBC: 4.79 10*6/uL (ref 4.20–5.80)
RDW: 11.5 % (ref 11.0–15.0)
WBC: 5 10*3/uL (ref 3.8–10.8)

## 2017-09-09 LAB — RPR: RPR: NONREACTIVE

## 2017-09-10 LAB — HIV-1 RNA QUANT-NO REFLEX-BLD
HIV 1 RNA Quant: <20 copies/mL
HIV-1 RNA Quant, Log: <1.3 Log copies/mL

## 2017-09-25 ENCOUNTER — Ambulatory Visit (INDEPENDENT_AMBULATORY_CARE_PROVIDER_SITE_OTHER): Payer: BLUE CROSS/BLUE SHIELD | Admitting: Infectious Diseases

## 2017-09-25 ENCOUNTER — Encounter: Payer: Self-pay | Admitting: Infectious Diseases

## 2017-09-25 VITALS — BP 121/81 | HR 83 | Temp 98.6°F | Wt 189.0 lb

## 2017-09-25 DIAGNOSIS — Z23 Encounter for immunization: Secondary | ICD-10-CM | POA: Diagnosis not present

## 2017-09-25 DIAGNOSIS — K219 Gastro-esophageal reflux disease without esophagitis: Secondary | ICD-10-CM | POA: Diagnosis not present

## 2017-09-25 DIAGNOSIS — Z79899 Other long term (current) drug therapy: Secondary | ICD-10-CM | POA: Diagnosis not present

## 2017-09-25 DIAGNOSIS — K5900 Constipation, unspecified: Secondary | ICD-10-CM

## 2017-09-25 DIAGNOSIS — Z113 Encounter for screening for infections with a predominantly sexual mode of transmission: Secondary | ICD-10-CM

## 2017-09-25 DIAGNOSIS — B20 Human immunodeficiency virus [HIV] disease: Secondary | ICD-10-CM

## 2017-09-25 MED ORDER — LUBIPROSTONE 24 MCG PO CAPS: 24.0000 ug | ORAL_CAPSULE | Freq: Two times a day (BID) | ORAL | 1 refills | Status: DC

## 2017-09-25 NOTE — Addendum Note (Signed)
Addended by: Makaylah Oddo C on: 09/25/2017 03:39 PM   Modules accepted: Orders

## 2017-09-25 NOTE — Assessment & Plan Note (Signed)
He doing well Offered/refused condoms.  Flu shot today rtc in 9 months.

## 2017-09-25 NOTE — Assessment & Plan Note (Addendum)
Given uptodate constipation beyond the basics. States he has tried all these.  Will consider lubiprostone

## 2017-09-25 NOTE — Progress Notes (Signed)
   Subjective:    Patient ID: Elijah LaymanFelton Peerson, male    DOB: 04/09/1971, 46 y.o.   MRN: [id number]  HPI 46yo M with hx of HIV+, on atripla. He was changed to genvoya 09-2016. Also hx of GERD.Taking OTC prilosec. This has improved somewhat. He still has constipation. No relief with Mg citrate, colace.   HIV 1 RNA Quant (copies/mL)  Date Value  09/08/2017 <20 NOT DETECTED  01/22/2017 <20 NOT DETECTED  10/02/2016 <20   CD4 T Cell Abs (/uL)  Date Value  09/08/2017 760  01/22/2017 1,090  10/02/2016 630    Review of Systems  Constitutional: Negative for appetite change, chills, fever and unexpected weight change.  Respiratory: Negative for cough and shortness of breath.   Gastrointestinal: Positive for abdominal pain and constipation. Negative for diarrhea.  Genitourinary: Negative for difficulty urinating.  Psychiatric/Behavioral: Positive for sleep disturbance. Negative for dysphoric mood.  trouble with sleep related to work.  Worried about halotosis, broken tooth.  Please see HPI. All other systems reviewed and negative.     Objective:   Physical Exam  Constitutional: He appears well-developed and well-nourished.  HENT:  Mouth/Throat: No oropharyngeal exudate.  Eyes: Pupils are equal, round, and reactive to light. EOM are normal.  Neck: Neck supple.  Cardiovascular: Normal rate, regular rhythm and normal heart sounds.   Pulmonary/Chest: Effort normal and breath sounds normal.  Abdominal: Soft. Bowel sounds are normal. There is no tenderness. There is no rebound.  Musculoskeletal: He exhibits no edema.  Lymphadenopathy:    He has no cervical adenopathy.  Psychiatric: He has a normal mood and affect.      Assessment & Plan:

## 2017-09-25 NOTE — Assessment & Plan Note (Signed)
Will continue prn PPI

## 2017-09-29 DIAGNOSIS — H40013 Open angle with borderline findings, low risk, bilateral: Secondary | ICD-10-CM | POA: Diagnosis not present

## 2017-09-29 DIAGNOSIS — H04123 Dry eye syndrome of bilateral lacrimal glands: Secondary | ICD-10-CM | POA: Diagnosis not present

## 2017-11-06 ENCOUNTER — Other Ambulatory Visit: Payer: Self-pay | Admitting: Infectious Diseases

## 2017-11-06 DIAGNOSIS — B20 Human immunodeficiency virus [HIV] disease: Secondary | ICD-10-CM

## 2018-03-31 DIAGNOSIS — H40013 Open angle with borderline findings, low risk, bilateral: Secondary | ICD-10-CM | POA: Diagnosis not present

## 2018-05-31 ENCOUNTER — Other Ambulatory Visit: Payer: Self-pay | Admitting: Infectious Diseases

## 2018-05-31 DIAGNOSIS — B20 Human immunodeficiency virus [HIV] disease: Secondary | ICD-10-CM

## 2018-07-14 ENCOUNTER — Other Ambulatory Visit: Payer: BLUE CROSS/BLUE SHIELD

## 2018-07-14 ENCOUNTER — Other Ambulatory Visit (HOSPITAL_COMMUNITY)
Admission: RE | Admit: 2018-07-14 | Discharge: 2018-07-14 | Disposition: A | Discharge status: Home / Self Care | Payer: BLUE CROSS/BLUE SHIELD | Source: Ambulatory Visit | Attending: Infectious Diseases | Admitting: Infectious Diseases

## 2018-07-14 DIAGNOSIS — Z79899 Other long term (current) drug therapy: Secondary | ICD-10-CM | POA: Diagnosis not present

## 2018-07-14 DIAGNOSIS — B20 Human immunodeficiency virus [HIV] disease: Secondary | ICD-10-CM

## 2018-07-14 DIAGNOSIS — Z113 Encounter for screening for infections with a predominantly sexual mode of transmission: Secondary | ICD-10-CM

## 2018-07-16 LAB — COMPREHENSIVE METABOLIC PANEL
AG Ratio: 1.4 (calc) (ref 1.0–2.5)
ALT: 19 U/L (ref 9–46)
AST: 19 U/L (ref 10–40)
Albumin: 4.2 g/dL (ref 3.6–5.1)
Alkaline phosphatase (APISO): 67 U/L (ref 40–115)
BUN: 12 mg/dL (ref 7–25)
CALCIUM: 9.1 mg/dL (ref 8.6–10.3)
CO2: 27 mmol/L (ref 20–32)
Chloride: 107 mmol/L (ref 98–110)
Creat: 1.33 mg/dL (ref 0.60–1.35)
GLUCOSE: 86 mg/dL (ref 65–99)
Globulin: 3 g/dL (calc) (ref 1.9–3.7)
Potassium: 4.1 mmol/L (ref 3.5–5.3)
Sodium: 143 mmol/L (ref 135–146)
TOTAL PROTEIN: 7.2 g/dL (ref 6.1–8.1)
Total Bilirubin: 0.5 mg/dL (ref 0.2–1.2)

## 2018-07-16 LAB — CBC
HCT: 43.4 % (ref 38.5–50.0)
HEMOGLOBIN: 14.8 g/dL (ref 13.2–17.1)
MCH: 32.9 pg (ref 27.0–33.0)
MCHC: 34.1 g/dL (ref 32.0–36.0)
MCV: 96.4 fL (ref 80.0–100.0)
MPV: 11 fL (ref 7.5–12.5)
Platelets: 205 10*3/uL (ref 140–400)
RBC: 4.5 10*6/uL (ref 4.20–5.80)
RDW: 11.5 % (ref 11.0–15.0)
WBC: 6 10*3/uL (ref 3.8–10.8)

## 2018-07-16 LAB — LIPID PANEL
Cholesterol: 172 mg/dL (ref <200)
HDL: 39 mg/dL — AB (ref >40)
LDL CHOLESTEROL (CALC): 94 mg/dL
Non-HDL Cholesterol (Calc): 133 mg/dL (calc) — ABNORMAL HIGH (ref <130)
TRIGLYCERIDES: 272 mg/dL — AB (ref <150)
Total CHOL/HDL Ratio: 4.4 (calc) (ref <5.0)

## 2018-07-16 LAB — RPR: RPR Ser Ql: NONREACTIVE

## 2018-07-16 LAB — HIV-1 RNA QUANT-NO REFLEX-BLD
HIV 1 RNA QUANT: 41 {copies}/mL — AB
HIV-1 RNA QUANT, LOG: 1.61 {Log_copies}/mL — AB

## 2018-07-16 LAB — URINE CYTOLOGY ANCILLARY ONLY
Chlamydia: NEGATIVE
Neisseria Gonorrhea: NEGATIVE

## 2018-07-16 LAB — T-HELPER CELL (CD4) - (RCID CLINIC ONLY)
CD4 T CELL HELPER: 27 % — AB (ref 33–55)
CD4 T Cell Abs: 800 /uL (ref 400–2700)

## 2018-07-28 ENCOUNTER — Ambulatory Visit: Payer: BLUE CROSS/BLUE SHIELD | Admitting: Infectious Diseases

## 2018-08-27 ENCOUNTER — Other Ambulatory Visit: Payer: Self-pay | Admitting: Infectious Diseases

## 2018-08-27 DIAGNOSIS — B20 Human immunodeficiency virus [HIV] disease: Secondary | ICD-10-CM

## 2018-09-24 ENCOUNTER — Telehealth: Payer: Self-pay

## 2018-09-24 ENCOUNTER — Ambulatory Visit: Payer: BLUE CROSS/BLUE SHIELD | Admitting: Family Medicine

## 2018-09-24 ENCOUNTER — Other Ambulatory Visit: Payer: Self-pay | Admitting: Infectious Diseases

## 2018-09-24 DIAGNOSIS — B20 Human immunodeficiency virus [HIV] disease: Secondary | ICD-10-CM

## 2018-09-24 NOTE — Telephone Encounter (Signed)
Received refill request from patients pharmacy for [facility]. Patient has not been in clinic to see Dr. Ninetta Lights since 09/2017. Patient needs to call office to set up an appointment with our office. Attempted to call patient to schedule the appointment, however was unable to speak with the patient nor leave a voicemail. Lorenso Courier, [place]

## 2018-09-30 ENCOUNTER — Ambulatory Visit (INDEPENDENT_AMBULATORY_CARE_PROVIDER_SITE_OTHER): Payer: BLUE CROSS/BLUE SHIELD | Admitting: Family Medicine

## 2018-09-30 ENCOUNTER — Encounter: Payer: Self-pay | Admitting: Family Medicine

## 2018-09-30 VITALS — BP 118/78 | HR 94 | Temp 98.4°F | Resp 17 | Ht 64.0 in | Wt 188.6 lb

## 2018-09-30 DIAGNOSIS — Z23 Encounter for immunization: Secondary | ICD-10-CM

## 2018-09-30 DIAGNOSIS — K581 Irritable bowel syndrome with constipation: Secondary | ICD-10-CM | POA: Diagnosis not present

## 2018-09-30 DIAGNOSIS — Z7689 Persons encountering health services in other specified circumstances: Secondary | ICD-10-CM

## 2018-09-30 DIAGNOSIS — Z125 Encounter for screening for malignant neoplasm of prostate: Secondary | ICD-10-CM | POA: Diagnosis not present

## 2018-09-30 DIAGNOSIS — K2100 Gastro-esophageal reflux disease with esophagitis, without bleeding: Secondary | ICD-10-CM

## 2018-09-30 MED ORDER — LINACLOTIDE 145 MCG PO CAPS: 145.0000 ug | ORAL_CAPSULE | Freq: Every day | ORAL | 1 refills | Status: DC

## 2018-09-30 NOTE — Progress Notes (Signed)
Attempted to call patient to advise I will trial him Linzess 145 mcg. In review of chart, I do not see that this medication ever prescribed. Amitza discontinued as medication is not covered by patients insurance.

## 2018-09-30 NOTE — Progress Notes (Signed)
Elijah Guerra, is a 47 y.o. male  WUJ:[id number]  NFA:[id number]  DOB - 07-14-1971  CC:  Chief Complaint  Patient presents with  . Establish Care    no concerns       HPI: Elijah Guerra is a 47 y.o. male is here today to establish care.   Elijah Guerra has Human immunodeficiency virus (HIV) disease (HCC); DISORDER, TOBACCO USE; GERD; Constipation; VOMITING; PROTEINURIA, MILD; Arthritis; Hyperglycemia; Erectile dysfunction; Contact dermatitis; Skin nodule; and Hepatitis B immune on their problem list.    Today's visit:  Elijah Guerra here to establish today. He is followed by [facility] for HIV management for more than 15 years. He was advised that he needs a PCP. He is very health conscious and receives routine dental exam every 6 months.  He has an upcoming eye appointment.   He suffers from chronic IBS and was prescribed lubiprostone, however was able to purchase do to medication was declined by insurance. He has struggled with chronic constipation in spite of good hydration efforts. He also reports severe acid reflux which he manages with PPI therapy and strict diet restriction. He requests a GI referral as he feels his symptoms may warrant EGD and or colonoscopy. His father was recently diagnosed with prostate cancer and he would like a PSA test. Will receive influenza and TDAP vaccines today. Patient denies new headaches, chest pain, abdominal pain, nausea, new weakness , numbness or tingling, SOB, edema, or worrisome cough.     Current medications: Current Outpatient Medications:  Marland Kitchen  GENVOYA 150-150-200-10 MG TABS tablet, TAKE 1 TABLET BY MOUTH DAILY WITH BREAKFAST, Disp: 30 tablet, Rfl: 0 .  ketoconazole (NIZORAL) 2 % cream, Apply 1 application topically as needed., Disp: , Rfl:  .  lubiprostone (AMITIZA) 24 MCG capsule, Take 1 capsule (24 mcg total) by mouth 2 (two) times daily with a meal., Disp: 30 capsule, Rfl: 1 .  omeprazole (PRILOSEC) 40 MG capsule, Take 1 capsule (40 mg  total) by mouth daily., Disp: 30 capsule, Rfl: 3   Pertinent family medical history: family history is not on file.   No Known Allergies  Social History   Socioeconomic History  . Marital status: Single    Spouse name: Not on file  . Number of children: Not on file  . Years of education: Not on file  . Highest education level: Not on file  Occupational History  . Not on file  Social Needs  . Financial resource strain: Not on file  . Food insecurity:    Worry: Not on file    Inability: Not on file  . Transportation needs:    Medical: Not on file    Non-medical: Not on file  Tobacco Use  . Smoking status: Former Smoker    Packs/day: 0.30    Types: Cigars  . Smokeless tobacco: Never Used  . Tobacco comment: congratulated!!  Substance and Sexual Activity  . Alcohol use: No    Alcohol/week: 0.0 standard drinks  . Drug use: No  . Sexual activity: Yes    Partners: Male    Birth control/protection: Condom    Comment: refused condoms  Lifestyle  . Physical activity:    Days per week: Not on file    Minutes per session: Not on file  . Stress: Not on file  Relationships  . Social connections:    Talks on phone: Not on file    Gets together: Not on file    Attends religious service: Not on file  Active member of club or organization: Not on file    Attends meetings of clubs or organizations: Not on file    Relationship status: Not on file  . Intimate partner violence:    Fear of current or ex partner: Not on file    Emotionally abused: Not on file    Physically abused: Not on file    Forced sexual activity: Not on file  Other Topics Concern  . Not on file  Social History Narrative  . Not on file    Review of Systems: Constitutional: Negative for fever, chills, diaphoresis, activity change, appetite change and fatigue. Respiratory: Negative for cough, choking, chest tightness, shortness of breath, wheezing and stridor.  Cardiovascular: Negative for chest pain,  palpitations and leg swelling. Gastrointestinal: chronic constipation  Genitourinary: Negative for dysuria, urgency, frequency, hematuria, flank pain, decreased urine volume, difficulty urinating. Musculoskeletal: Negative for back pain, joint swelling, arthralgia and gait problem. Neurological: Negative for dizziness, tremors, seizures, syncope, facial asymmetry, speech difficulty, weakness, light-headedness, numbness and headaches.  Hematological: Negative for adenopathy. Does not bruise/bleed easily. Psychiatric/Behavioral: Negative for hallucinations, behavioral problems, confusion, dysphoric mood, decreased concentration and agitation.    Objective:   Vitals:   09/30/18 1616  BP: 118/78  Pulse: 94  Resp: 17  Temp: 98.4 F (36.9 C)  SpO2: 98%    BP Readings from Last 3 Encounters:  09/30/18 118/78  09/25/17 121/81  01/22/17 129/82    Filed Weights   09/30/18 1616  Weight: 188 lb 9.6 oz (85.5 kg)      Physical Exam: Constitutional: Patient appears well-developed and well-nourished. No distress. HENT: Normocephalic, atraumatic, External right and left ear normal. Oropharynx is clear and moist.  Eyes: Conjunctivae and EOM are normal. PERRLA, no scleral icterus. Neck: Normal ROM. Neck supple. No JVD. No tracheal deviation. No thyromegaly. CVS: RRR, S1/S2 +, no murmurs, no gallops, no carotid bruit.  Pulmonary: Effort and breath sounds normal, no stridor, rhonchi, wheezes, rales.  Abdominal: Soft. BS +, no distension, tenderness, rebound or guarding.  Musculoskeletal: Normal range of motion. No edema and no tenderness.  Neuro: Alert. Normal muscle tone coordination. Normal gait. BUE and BLE strength 5/5. Bilateral hand grips symmetrical. Skin: Skin is warm and dry. No rash noted. Not diaphoretic. No erythema. No pallor. Psychiatric: Normal mood and affect. Behavior, judgment, thought content normal.  Lab Results (prior encounters)  Lab Results  Component Value Date    WBC 6.0 07/14/2018   HGB 14.8 07/14/2018   HCT 43.4 07/14/2018   MCV 96.4 07/14/2018   PLT 205 07/14/2018   Lab Results  Component Value Date   CREATININE 1.33 07/14/2018   BUN 12 07/14/2018   NA 143 07/14/2018   K 4.1 07/14/2018   CL 107 07/14/2018   CO2 27 07/14/2018    Lab Results  Component Value Date   HGBA1C 5.0 08/30/2013       Component Value Date/Time   CHOL 172 07/14/2018 1739   TRIG 272 (H) 07/14/2018 1739   HDL 39 (L) 07/14/2018 1739   CHOLHDL 4.4 07/14/2018 1739   VLDL 30 01/22/2017 1729   LDLCALC 94 07/14/2018 1739        Assessment and plan:  1. Encounter to establish care 2. Gastroesophageal reflux disease with esophagitis -continue omeprazole 40 mg once daily and strict dietary management - Ambulatory referral to Gastroenterology  3. Screening PSA (prostate specific antigen), father recently diagnosed with prostate cancer. Given family history recommended annual PSA surveillance  - PSA  4. Irritable bowel syndrome with constipation -will trial Linzess.  Referred to gastroenterology as this has been a chronic on-going problem.   Return in about 6 months (around 03/31/2019).   The patient was given clear instructions to go to ER or return to medical center if symptoms don't improve, worsen or new problems develop. The patient verbalized understanding. The patient was advised  to call and obtain lab results if they haven't heard anything from out office within 7-10 business days.   A total of 30 minutes spent, greater than 50 % of this time was spent counseling and coordination of care.   Joaquin Courts, FNP Primary Care at [facility] [address], [place] [address] [phone]: [phone]    This note has been created with Dragon speech recognition software and Paediatric nurse. Any transcriptional errors are unintentional.

## 2018-09-30 NOTE — Patient Instructions (Addendum)
Thank you for choosing Primary Care at [facility] to be your medical home!    Elijah Guerra was seen by Joaquin Courts, FNP today.   Elijah Guerra's primary care provider is Bing Neighbors, FNP.   For the best care possible, you should try to see Joaquin Courts, FNP-C whenever you come to the clinic.   We look forward to seeing you again soon!  If you have any questions about your visit today, please call us at [phone] or feel free to reach your primary care provider via MyChart.        Irritable Bowel Syndrome, Adult Irritable bowel syndrome (IBS) is not one specific disease. It is a group of symptoms that affects the organs responsible for digestion (gastrointestinal or GI tract). To regulate how your GI tract works, your body sends signals back and forth between your intestines and your brain. If you have IBS, there may be a problem with these signals. As a result, your GI tract does not function normally. Your intestines may become more sensitive and overreact to certain things. This is especially true when you eat certain foods or when you are under stress. There are four types of IBS. These may be determined based on the consistency of your stool:  IBS with diarrhea.  IBS with constipation.  Mixed IBS.  Unsubtyped IBS.  It is important to know which type of IBS you have. Some treatments are more likely to be helpful for certain types of IBS. What are the causes? The exact cause of IBS is not known. What increases the risk? You may have a higher risk of IBS if:  You are a woman.  You are younger than 47 years old.  You have a family history of IBS.  You have mental health problems.  You have had bacterial infection of your GI tract.  What are the signs or symptoms? Symptoms of IBS vary from person to person. The main symptom is abdominal pain or discomfort. Additional symptoms usually include one or more of the following:  Diarrhea, constipation, or  both.  Abdominal swelling or bloating.  Feeling full or sick after eating a small or regular-size meal.  Frequent gas.  Mucus in the stool.  A feeling of having more stool left after a bowel movement.  Symptoms tend to come and go. They may be associated with stress, psychiatric conditions, or nothing at all. How is this diagnosed? There is no specific test to diagnose IBS. Your health care provider will make a diagnosis based on a physical exam, medical history, and your symptoms. You may have other tests to rule out other conditions that may be causing your symptoms. These may include:  Blood tests.  X-rays.  CT scan.  Endoscopy and colonoscopy. This is a test in which your GI tract is viewed with a long, thin, flexible tube.  How is this treated? There is no cure for IBS, but treatment can help relieve symptoms. IBS treatment often includes:  Changes to your diet, such as: ? Eating more fiber. ? Avoiding foods that cause symptoms. ? Drinking more water. ? Eating regular, medium-sized portioned meals.  Medicines. These may include: ? Fiber supplements if you have constipation. ? Medicine to control diarrhea (antidiarrheal medicines). ? Medicine to help control muscle spasms in your GI tract (antispasmodic medicines). ? Medicines to help with any mental health issues, such as antidepressants or tranquilizers.  Therapy. ? Talk therapy may help with anxiety, depression, or other mental health issues  that can make IBS symptoms worse.  Stress reduction. ? Managing your stress can help keep symptoms under control.  Follow these instructions at home:  Take medicines only as directed by your health care provider.  Eat a healthy diet. ? Avoid foods and drinks with added sugar. ? Include more whole grains, fruits, and vegetables gradually into your diet. This may be especially helpful if you have IBS with constipation. ? Avoid any foods and drinks that make your symptoms  worse. These may include dairy products and caffeinated or carbonated drinks. ? Do not eat large meals. ? Drink enough fluid to keep your urine clear or pale yellow.  Exercise regularly. Ask your health care provider for recommendations of good activities for you.  Keep all follow-up visits as directed by your health care provider. This is important. Contact a health care provider if:  You have constant pain.  You have trouble or pain with swallowing.  You have worsening diarrhea. Get help right away if:  You have severe and worsening abdominal pain.  You have diarrhea and: ? You have a rash, stiff neck, or severe headache. ? You are irritable, sleepy, or difficult to awaken. ? You are weak, dizzy, or extremely thirsty.  You have bright red blood in your stool or you have black tarry stools.  You have unusual abdominal swelling that is painful.  You vomit continuously.  You vomit blood (hematemesis).  You have both abdominal pain and a fever. This information is not intended to replace advice given to you by your health care provider. Make sure you discuss any questions you have with your health care provider. Document Released: 11/11/2005 Document Revised: 04/12/2016 Document Reviewed: 07/29/2014 Elsevier Interactive Patient Education  2018 [employer].    Prostate-Specific Antigen Test Why am I having this test? The prostate-specific antigen (PSA) test is performed to determine how much PSA you have in your blood. PSA is a type of protein that is normally present in the prostate gland. Certain conditions can cause PSA blood levels to increase, such as:  Infection in the prostate (prostatitis).  Enlargement of the prostate (hypertrophy).  Prostate cancer.  Because PSA levels increase greatly from prostate cancer, this test can be used to confirm a diagnosis of prostate cancer. It may also be used to monitor treatment for prostate cancer and to watch for a return of  prostate cancer after treatment has finished. This test has a very high false-positive rate. Therefore, routine PSA screening for all men is no longer recommended. A false-positive result is incorrect because it indicates a condition or finding is present when it is not. What kind of sample is taken? A blood sample is required for this test. It is usually collected by inserting a needle into a vein or by sticking a finger with a small needle. How do I prepare for this test? There is no preparation required for this test. However, there are factors that can affect the results of a PSA test. To get the most accurate results:  Avoid having a rectal exam within several hours before having your blood drawn for this test.  Avoid having any procedures performed on the prostate gland within 6 weeks of having this test.  Avoid ejaculating within 24 hours of having this test.  Tell your health care provider if you had a recent urinary tract infection (UTI).  Tell your health care provider if you are taking medicines to assist with hair growth, such as finasteride.  Tell your  health care provider if you have been exposed to a medicine called diethylstilbestrol.  Let your health care provider know if any of these factors apply to you. You may be asked to reschedule the test. What are the reference ranges? Reference ranges are established after testing a large group of people. Reference ranges may vary among different people, labs, and hospitals. It is your responsibility to obtain your test results. Ask the lab or department performing the test when and how you will get your results.  Low: 0-2.5 ng/mL.  Slightly to moderately elevated: 2.6-10.0 ng/mL.  Moderately elevated: 10.0-19.9 ng/mL.  Significantly elevated: 20 ng/mL or greater.  What do the results mean? PSA test results greater than 4 ng/mL are found in the majority of men with prostate cancer. If your test result is above this level,  this can indicate an increased risk for prostate cancer. Increased PSA levels can also indicate other health conditions. Talk with your health care provider to discuss your results, treatment options, and if necessary, the need for more tests. Talk with your health care provider if you have any questions about your results. Talk with your health care provider to discuss your results, treatment options, and if necessary, the need for more tests. Talk with your health care provider if you have any questions about your results. This information is not intended to replace advice given to you by your health care provider. Make sure you discuss any questions you have with your health care provider. Document Released: 12/14/2004 Document Revised: 07/17/2016 Document Reviewed: 04/06/2014 Elsevier Interactive Patient Education  [employer].

## 2018-10-01 ENCOUNTER — Encounter: Payer: Self-pay | Admitting: Family Medicine

## 2018-10-01 DIAGNOSIS — H40013 Open angle with borderline findings, low risk, bilateral: Secondary | ICD-10-CM | POA: Diagnosis not present

## 2018-10-01 LAB — PSA: PROSTATE SPECIFIC AG, SERUM: 0.8 ng/mL (ref 0.0–4.0)

## 2018-10-01 NOTE — Progress Notes (Signed)
Lab letter sent 

## 2018-10-13 ENCOUNTER — Other Ambulatory Visit: Payer: Self-pay | Admitting: Behavioral Health

## 2018-10-13 DIAGNOSIS — B20 Human immunodeficiency virus [HIV] disease: Secondary | ICD-10-CM

## 2018-10-13 MED ORDER — ELVITEG-COBIC-EMTRICIT-TENOFAF 150-150-200-10 MG PO TABS: 1.0000 | ORAL_TABLET | Freq: Every day | ORAL | 0 refills | Status: DC

## 2018-10-30 ENCOUNTER — Encounter: Payer: Self-pay | Admitting: Family Medicine

## 2018-11-03 ENCOUNTER — Encounter: Payer: Self-pay | Admitting: Infectious Diseases

## 2018-11-03 ENCOUNTER — Ambulatory Visit: Payer: BLUE CROSS/BLUE SHIELD | Admitting: Infectious Diseases

## 2018-11-03 VITALS — BP 133/79 | HR 91 | Temp 98.0°F | Ht 66.0 in | Wt 193.0 lb

## 2018-11-03 DIAGNOSIS — Z23 Encounter for immunization: Secondary | ICD-10-CM

## 2018-11-03 DIAGNOSIS — K5900 Constipation, unspecified: Secondary | ICD-10-CM

## 2018-11-03 DIAGNOSIS — Z0000 Encounter for general adult medical examination without abnormal findings: Secondary | ICD-10-CM

## 2018-11-03 DIAGNOSIS — B20 Human immunodeficiency virus [HIV] disease: Secondary | ICD-10-CM

## 2018-11-03 MED ORDER — ELVITEG-COBIC-EMTRICIT-TENOFAF 150-150-200-10 MG PO TABS: 1.0000 | ORAL_TABLET | Freq: Every day | ORAL | 11 refills | Status: DC

## 2018-11-03 NOTE — Patient Instructions (Addendum)
Nice to meet you today - will refill your Elijah LuisGenvoya for a year and see you back in December 2020 with labs 2 weeks before your visit please.   Please continue your Genvoya once a day with food. Will release your results to MyChart when we get them.   Please call [facility] @ [phone] to schedule a dental appointment

## 2018-11-03 NOTE — Addendum Note (Signed)
Addended by: Blanchard KelchIXON, STEPHANIE N on: 11/03/2018 05:09 PM   Modules accepted: Orders

## 2018-11-03 NOTE — Addendum Note (Signed)
Addended by: Valarie ConesSTALEY, Mihailo Sage on: 11/03/2018 04:58 PM   Modules accepted: Orders

## 2018-11-05 LAB — HIV-1 RNA QUANT-NO REFLEX-BLD
HIV 1 RNA Quant: <20 copies/mL
HIV-1 RNA Quant, Log: <1.3 Log copies/mL

## 2018-11-06 DIAGNOSIS — Z0000 Encounter for general adult medical examination without abnormal findings: Secondary | ICD-10-CM | POA: Insufficient documentation

## 2018-11-06 NOTE — Addendum Note (Signed)
Addended by: Blanchard KelchIXON, Floriene Jeschke N on: 11/06/2018 09:15 AM   Modules accepted: Orders

## 2018-11-06 NOTE — Progress Notes (Signed)
Name: Elijah Guerra  DOB: 09/12/1971 MRN: [id number] PCP: Bing NeighborsHarris, Kimberly S, FNP    Patient Active Problem List   Diagnosis Date Noted  . Healthcare maintenance 11/06/2018  . Hepatitis B immune 01/22/2017  . Skin nodule 09/04/2015  . Contact dermatitis 09/15/2014  . Hyperglycemia 08/30/2013  . Erectile dysfunction 08/30/2013  . Arthritis 02/12/2012  . GERD 12/29/2009  . Constipation 12/29/2009  . VOMITING 12/29/2009  . PROTEINURIA, MILD 04/01/2007  . Human immunodeficiency virus (HIV) disease (HCC) 01/01/2007  . DISORDER, TOBACCO USE 01/01/2007     Brief Narrative:  Elijah LaymanFelton Flater is a 47 y.o. male with HIV infection well controlled. HIV Risk: heterosexual. OI Hx: none known.    Previous Regimens: . Atripla . Genvoya 2017 >> suppressed   Genotypes: . None on file   Subjective:  CC:  Routine HIV follow up care. Unable to get his constipation medication, still struggling with symptoms.   HPI:  Previously followed by Dr. Ninetta LightsHatcher, last office visit 13 months ago. He is doing well on his Genvoya and reports no concern over s/e, abillity to get medicine (aside from us not sending in rx until he was seen again) and 100% adherence to regimen. He is a IT trainertrucker and drives all over. Declined to answer much in regard to h/o sexual partners but reports females only. Working with his PCP on chronic constipation issues; was given rx for Linzess 7073m ago but has not received and uncertain as to why. Has received flu vaccine already.   Depression screen PHQ 2/9 11/03/2018  Decreased Interest 0  Down, Depressed, Hopeless 0  PHQ - 2 Score 0    Review of Systems  Constitutional: Negative for chills, fever, malaise/fatigue and weight loss.  HENT: Negative for sore throat.        No dental problems  Respiratory: Negative for cough and sputum production.   Cardiovascular: Negative for chest pain and leg swelling.  Gastrointestinal: Positive for constipation. Negative for abdominal pain,  diarrhea and vomiting.  Genitourinary: Negative for dysuria and flank pain.  Musculoskeletal: Negative for joint pain, myalgias and neck pain.  Skin: Negative for rash.  Neurological: Negative for dizziness, tingling and headaches.  Psychiatric/Behavioral: Negative for depression and substance abuse. The patient is not nervous/anxious and does not have insomnia.     Past Medical History:  Diagnosis Date  . HIV (human immunodeficiency virus infection) (HCC)     Outpatient Medications Prior to Visit  Medication Sig Dispense Refill  . ketoconazole (NIZORAL) 2 % cream Apply 1 application topically as needed.    . linaclotide (LINZESS) 145 MCG CAPS capsule Take 1 capsule (145 mcg total) by mouth daily before breakfast. Take 30 minutes prior to meals. 30 capsule 1  . omeprazole (PRILOSEC) 40 MG capsule Take 1 capsule (40 mg total) by mouth daily. 30 capsule 3  . elvitegravir-cobicistat-emtricitabine-tenofovir (GENVOYA) 150-150-200-10 MG TABS tablet Take 1 tablet by mouth daily with breakfast. 30 tablet 0   No facility-administered medications prior to visit.      No Known Allergies  Social History   Tobacco Use  . Smoking status: Former Smoker    Packs/day: 0.30    Types: Cigars  . Smokeless tobacco: Never Used  . Tobacco comment: congratulated!!  Substance Use Topics  . Alcohol use: No    Alcohol/week: 0.0 standard drinks  . Drug use: No    No family history on file.  Social History   Substance and Sexual Activity  Sexual Activity Yes  . Partners:  Male  . Birth control/protection: Condom   Comment: refused condoms     Objective:   Vitals:   11/03/18 1608  BP: 133/79  Pulse: 91  Temp: 98 F (36.7 C)  Weight: 193 lb (87.5 kg)  Height: 5\' 6"  (1.676 m)   Body mass index is 31.15 kg/m.  Physical Exam Vitals signs reviewed.  Constitutional:      Appearance: He is well-developed.     Comments: Seated comfortably in chair during visit.   HENT:      Mouth/Throat:     Dentition: Normal dentition. No dental abscesses.  Cardiovascular:     Rate and Rhythm: Normal rate and regular rhythm.     Heart sounds: Normal heart sounds.  Pulmonary:     Effort: Pulmonary effort is normal.     Breath sounds: Normal breath sounds.  Abdominal:     General: There is no distension.     Palpations: Abdomen is soft.     Tenderness: There is no abdominal tenderness.  Lymphadenopathy:     Cervical: No cervical adenopathy.  Skin:    General: Skin is warm and dry.     Findings: No rash.  Neurological:     Mental Status: He is alert and oriented to person, place, and time.  Psychiatric:        Judgment: Judgment normal.    Lab Results Lab Results  Component Value Date   WBC 6.0 07/14/2018   HGB 14.8 07/14/2018   HCT 43.4 07/14/2018   MCV 96.4 07/14/2018   PLT 205 07/14/2018    Lab Results  Component Value Date   CREATININE 1.33 07/14/2018   BUN 12 07/14/2018   NA 143 07/14/2018   K 4.1 07/14/2018   CL 107 07/14/2018   CO2 27 07/14/2018    Lab Results  Component Value Date   ALT 19 07/14/2018   AST 19 07/14/2018   ALKPHOS 75 01/22/2017   BILITOT 0.5 07/14/2018    Lab Results  Component Value Date   CHOL 172 07/14/2018   HDL 39 (L) 07/14/2018   LDLCALC 94 07/14/2018   TRIG 272 (H) 07/14/2018   CHOLHDL 4.4 07/14/2018   HIV 1 RNA Quant (copies/mL)  Date Value  11/03/2018 <20 NOT DETECTED  07/14/2018 41 (H)  09/08/2017 <20 NOT DETECTED   CD4 T Cell Abs (/uL)  Date Value  07/14/2018 800  09/08/2017 760  01/22/2017 1,090     Assessment & Plan:   Problem List Items Addressed This Visit      Unprioritized   Constipation    Sounds like he needed a PA for Linzess - I asked him to contact his PCP.       Healthcare maintenance    Prevnar today. Otherwise UTD for vaccines.  Dental clinic referral for routine care.       Human immunodeficiency virus (HIV) disease (HCC) - Primary    Doing well on Genvoya; viral blip in  August (41 copies). I explained that this happens from time to time due to various settings but it is important to trend values. He will repeat VL today but I suspect this Is just a blip. Will refill genvoya. Refused condoms. He can return to clinic in 1 year with annual labs prior to visit. Declined STI testing today.       Relevant Medications   elvitegravir-cobicistat-emtricitabine-tenofovir (GENVOYA) 150-150-200-10 MG TABS tablet   Other Relevant Orders   CBC   T-helper cell (CD4)- ([facility] clinic only)  Comprehensive metabolic panel   RPR   HIV-1 RNA quant-no reflex-bld   HIV-1 RNA quant-no reflex-bld (Completed)   Pneumococcal conjugate vaccine 13-valent IM (Completed)   Urinalysis   Urine cytology ancillary only    Other Visit Diagnoses    Need for vaccination with 13-polyvalent pneumococcal conjugate vaccine       Relevant Orders   Pneumococcal conjugate vaccine 13-valent IM (Completed)      Rexene Alberts, MSN, NP-C [facility] Pager: [phone] Office: [phone]  11/06/18  9:14 AM

## 2018-11-06 NOTE — Assessment & Plan Note (Signed)
Prevnar today. Otherwise UTD for vaccines.  Dental clinic referral for routine care.

## 2018-11-06 NOTE — Assessment & Plan Note (Signed)
Sounds like he needed a PA for Linzess - I asked him to contact his PCP.

## 2018-11-06 NOTE — Assessment & Plan Note (Signed)
Doing well on Genvoya; viral blip in August (41 copies). I explained that this happens from time to time due to various settings but it is important to trend values. He will repeat VL today but I suspect this Is just a blip. Will refill genvoya. Refused condoms. He can return to clinic in 1 year with annual labs prior to visit. Declined STI testing today.

## 2018-12-01 ENCOUNTER — Encounter: Payer: Self-pay | Admitting: Gastroenterology

## 2018-12-01 ENCOUNTER — Ambulatory Visit: Payer: BLUE CROSS/BLUE SHIELD | Admitting: Gastroenterology

## 2018-12-01 VITALS — BP 124/86 | HR 84 | Ht 64.0 in | Wt 191.0 lb

## 2018-12-01 DIAGNOSIS — K219 Gastro-esophageal reflux disease without esophagitis: Secondary | ICD-10-CM | POA: Diagnosis not present

## 2018-12-01 DIAGNOSIS — K5909 Other constipation: Secondary | ICD-10-CM

## 2018-12-01 DIAGNOSIS — K625 Hemorrhage of anus and rectum: Secondary | ICD-10-CM

## 2018-12-01 DIAGNOSIS — R140 Abdominal distension (gaseous): Secondary | ICD-10-CM | POA: Diagnosis not present

## 2018-12-01 MED ORDER — PEG-KCL-NACL-NASULF-NA ASC-C 140 G PO SOLR: 140.0000 g | ORAL | 0 refills | Status: DC

## 2018-12-01 NOTE — Progress Notes (Signed)
[facility] Consult Note:  History: Elijah Guerra 12/01/2018  Referring physician: Bing Neighbors, FNP  Reason for consult/chief complaint: Gastroesophageal Reflux (Patient is awakened from sleep by reflux, then vomits earlier meal) and Irritable Bowel Syndrome (Chronic constipation, abdominal cramping, feels as if he needs bowel movement and nothing happens)   Subjective  HPI:  This is a very pleasant 48 year old man referred by primary care for chronic digestive issues.  He has many years of chronic constipation where he may go 2 to 3 days without a BM despite feelings of needing to do so.  He has generalized abdominal bloating and feels full much of the time, so he often only eats once a day.  He has his last meal about 7 in the evening, but awakens at least once a week with what sounds like severe reflux, which she describes as "projectile".  He does not get heartburn feelings of regurgitation during the day.  Elijah Guerra denies dysphagia or odynophagia change of appetite or weight loss. He is to have rectal bleeding quite rarely, but it has occurred more frequently in the last 4 to 6 months even without worsening of the constipation.  There was an ED visit for this at some point in the past.  He has not previously had GI evaluation.  Symptoms have not improved much on fiber or over-the-counter laxatives.  He believes that Dr. Ninetta Lights tried to prescribe Linzess, but insurance would not authorize it.  Thus, he has not taken any prescription treatments for constipation.  ROS:  Review of Systems  Constitutional: Negative for appetite change and unexpected weight change.  HENT: Negative for mouth sores and voice change.   Eyes: Negative for pain and redness.  Respiratory: Negative for cough and shortness of breath.   Cardiovascular: Negative for chest pain and palpitations.  Genitourinary: Negative for dysuria and hematuria.  Musculoskeletal: Negative for arthralgias  and myalgias.  Skin: Negative for pallor and rash.  Neurological: Negative for weakness and headaches.  Hematological: Negative for adenopathy.     Past Medical History: Past Medical History:  Diagnosis Date  . HIV (human immunodeficiency virus infection) (HCC)      Past Surgical History: No past surgical history on file. No prior surgery  Family History: Family History  Problem Relation Age of Onset  . Heart disease Mother   . Prostate cancer Father     Social History: Social History   Socioeconomic History  . Marital status: Single    Spouse name: Not on file  . Number of children: Not on file  . Years of education: Not on file  . Highest education level: Not on file  Occupational History  . Not on file  Social Needs  . Financial resource strain: Not on file  . Food insecurity:    Worry: Not on file    Inability: Not on file  . Transportation needs:    Medical: Not on file    Non-medical: Not on file  Tobacco Use  . Smoking status: Former Smoker    Packs/day: 0.30    Types: Cigars  . Smokeless tobacco: Never Used  . Tobacco comment: congratulated!!  Substance and Sexual Activity  . Alcohol use: No    Alcohol/week: 0.0 standard drinks  . Drug use: No  . Sexual activity: Yes    Partners: Male    Birth control/protection: Condom    Comment: refused condoms  Lifestyle  . Physical activity:    Days per week: Not on file  Minutes per session: Not on file  . Stress: Not on file  Relationships  . Social connections:    Talks on phone: Not on file    Gets together: Not on file    Attends religious service: Not on file    Active member of club or organization: Not on file    Attends meetings of clubs or organizations: Not on file    Relationship status: Not on file  Other Topics Concern  . Not on file  Social History Narrative  . 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 elvitegravir-cobicistat-emtricitabine-tenofovir (GENVOYA) 150-150-200-10 MG TABS tablet Take 1 tablet by mouth daily with breakfast. 30 tablet 11  . ketoconazole (NIZORAL) 2 % cream Apply 1 application topically as needed.     No current facility-administered medications for this visit.       ___________________________________________________________________ Objective   Exam:  BP 124/86   Pulse 84   Ht 5\' 4"  (1.626 m)   Wt 191 lb (86.6 kg)   BMI 32.79 kg/m    General: this is a(n) well-appearing man  Eyes: sclera anicteric, no redness  ENT: oral mucosa moist without lesions, no cervical or supraclavicular lymphadenopathy, good dentition  CV: RRR without murmur, S1/S2, no JVD, no peripheral edema  Resp: clear to auscultation bilaterally, normal RR and effort noted  GI: soft, no tenderness, with active bowel sounds. No guarding or palpable organomegaly noted.  Skin; warm and dry, no rash or jaundice noted.  Multiple tattoos  Neuro: awake, alert and oriented x 3. Normal gross motor function and fluent speech  Labs:  CBC Latest Ref Rng & Units 07/14/2018 09/08/2017 01/22/2017  WBC 3.8 - 10.8 Thousand/uL 6.0 5.0 7.4  Hemoglobin 13.2 - 17.1 g/dL 16.114.8 09.615.5 04.515.1  Hematocrit 38.5 - 50.0 % 43.4 45.6 45.9  Platelets 140 - 400 Thousand/uL 205 203 241   CMP Latest Ref Rng & Units 07/14/2018 09/08/2017 01/22/2017  Glucose 65 - 99 mg/dL 86 90 83  BUN 7 - 25 mg/dL 12 18 12   Creatinine 0.60 - 1.35 mg/dL 4.091.33 8.111.30 9.14(N1.42(H)  Sodium 135 - 146 mmol/L 143 143 141  Potassium 3.5 - 5.3 mmol/L 4.1 4.0 4.4  Chloride 98 - 110 mmol/L 107 107 104  CO2 20 - 32 mmol/L 27 26 21   Calcium 8.6 - 10.3 mg/dL 9.1 9.5 9.8  Total Protein 6.1 - 8.1 g/dL 7.2 7.8 7.7  Total Bilirubin 0.2 - 1.2 mg/dL 0.5 0.8 0.7  Alkaline Phos 40 - 115 U/L - - 75  AST 10 - 40 U/L 19 26 31   ALT 9 - 46 U/L 19 30 42   Most recent HIV viral load undetected Detected  at 41 in August 2019, undetected in October and February 2018 CD4 count 800 in August 2019, 760 in October 2018  Radiologic Studies:  UGIS normal 2011 (reportedly done for vomiting, abdominal pain and constipation)   Last TSH normal in 2011 Assessment: Encounter Diagnoses  Name Primary?  . Chronic constipation Yes  . Gastroesophageal reflux disease, esophagitis presence not specified   . Abdominal bloating   . Rectal bleeding     He seems to most likely have a functional bowel disorder going on for many years, though the rectal bleeding has become more frequent.  Although the bleeding may be benign and related to constipation, it warrants further evaluation.  It does  not seem he would likely benefit from acid suppression, since he does not get heartburn or daytime symptoms.  He just gets severe nighttime regurgitation on occasion.  Plan:  Upper endoscopy and colonoscopy to rule out structural sources for the symptoms. He was agreeable after discussion of procedure and risks.  The benefits and risks of the planned procedure were described in detail with the patient or (when appropriate) their health care proxy.  Risks were outlined as including, but not limited to, bleeding, infection, perforation, adverse medication reaction leading to cardiac or pulmonary decompensation, or pancreatitis (if ERCP).  The limitation of incomplete mucosal visualization was also discussed.  No guarantees or warranties were given.  Trial of prucalopride 2 mg once daily, sufficient samples for 12 to 14-day course were given.  He was advised to stop the medicine if it causes diarrhea, worsened abdominal pain or worsened bleeding.   Thank you for the courtesy of this consult.  Please call me with any questions or concerns.  Elijah Guerra  CC: Referring provider noted above

## 2018-12-01 NOTE — Patient Instructions (Signed)
If you are age 48 or older, your body mass index should be between 23-30. Your Body mass index is 32.79 kg/m. If this is out of the aforementioned range listed, please consider follow up with your Primary Care Provider.  If you are age 48 or younger, your body mass index should be between 19-25. Your Body mass index is 32.79 kg/m. If this is out of the aformentioned range listed, please consider follow up with your Primary Care Provider.   You have been scheduled for an endoscopy and colonoscopy. Please follow the written instructions given to you at your visit today. Please pick up your prep supplies at the pharmacy within the next 1-3 days. If you use inhalers (even only as needed), please bring them with you on the day of your procedure. Your physician has requested that you go to www.startemmi.com and enter the access code given to you at your visit today. This web site gives a general overview about your procedure. However, you should still follow specific instructions given to you by our office regarding your preparation for the procedure.  Medication Samples have been provided to the patient.  Drug name: motegrity        Strength: 2mg          Qty: 14  LOT: [id number]  Exp.Date: 09-2019  Dosing instructions: one a day  The patient has been instructed regarding the correct time, dose, and frequency of taking this medication, including desired effects and most common side effects.   It was a pleasure to see you today!  Dr. Myrtie Neitheranis

## 2018-12-09 ENCOUNTER — Encounter: Payer: Self-pay | Admitting: Gastroenterology

## 2018-12-15 ENCOUNTER — Encounter: Payer: Self-pay | Admitting: Gastroenterology

## 2018-12-15 ENCOUNTER — Ambulatory Visit (AMBULATORY_SURGERY_CENTER): Payer: BLUE CROSS/BLUE SHIELD | Admitting: Gastroenterology

## 2018-12-15 VITALS — BP 90/52 | HR 90 | Temp 97.7°F | Resp 21 | Ht 64.0 in | Wt 191.0 lb

## 2018-12-15 DIAGNOSIS — R1030 Lower abdominal pain, unspecified: Secondary | ICD-10-CM

## 2018-12-15 DIAGNOSIS — K219 Gastro-esophageal reflux disease without esophagitis: Secondary | ICD-10-CM

## 2018-12-15 DIAGNOSIS — K5909 Other constipation: Secondary | ICD-10-CM

## 2018-12-15 DIAGNOSIS — K625 Hemorrhage of anus and rectum: Secondary | ICD-10-CM

## 2018-12-15 MED ORDER — SODIUM CHLORIDE 0.9 % IV SOLN: 500.0000 mL | Freq: Once | INTRAVENOUS | Status: DC

## 2018-12-15 NOTE — Op Note (Signed)
[facility] Patient Name: Elijah Guerra Procedure Date: 12/15/2018 1:10 PM MRN: [id number] Endoscopist: Sherilyn Cooter L. Myrtie Neither , MD Age: 48 Referring MD:  Date of Birth: [birth year] Gender: Male Account #: [id number] Procedure:                Colonoscopy Indications:              Generalized abdominal pain, Rectal bleeding,                            Constipation Medicines:                Monitored Anesthesia Care Procedure:                Pre-Anesthesia Assessment:                           - Prior to the procedure, a History and Physical                            was performed, and patient medications and                            allergies were reviewed. The patient's tolerance of                            previous anesthesia was also reviewed. The risks                            and benefits of the procedure and the sedation                            options and risks were discussed with the patient.                            All questions were answered, and informed consent                            was obtained. Prior Anticoagulants: The patient has                            taken no previous anticoagulant or antiplatelet                            agents. ASA Grade Assessment: II - A patient with                            mild systemic disease. After reviewing the risks                            and benefits, the patient was deemed in                            satisfactory condition to undergo the procedure.  After obtaining informed consent, the colonoscope                            was passed under direct vision. Throughout the                            procedure, the patient's blood pressure, pulse, and                            oxygen saturations were monitored continuously. The                            Colonoscope was introduced through the anus and                            advanced to the the cecum, identified by             appendiceal orifice and ileocecal valve. The                            colonoscopy was performed without difficulty. The                            patient tolerated the procedure well. Scope In: 1:34:55 PM Scope Out: 1:49:30 PM Scope Withdrawal Time: 0 hours 6 minutes 40 seconds  Total Procedure Duration: 0 hours 14 minutes 35 seconds  Findings:                 The perianal and digital rectal examinations were                            normal.                           Internal hemorrhoids were found.                           The exam was otherwise without abnormality on                            direct and retroflexion views. Complications:            No immediate complications. Estimated Blood Loss:     Estimated blood loss: none. Impression:               - Internal hemorrhoids.                           - The examination was otherwise normal on direct                            and retroflexion views.                           - No specimens collected. Recommendation:           - Patient has a contact number available for  emergencies. The signs and symptoms of potential                            delayed complications were discussed with the                            patient. Return to normal activities tomorrow.                            Written discharge instructions were provided to the                            patient.                           - Resume previous diet.                           - Continue present medications, including                            prucalopride 2mg  tablet: one half tablet by mouth                            every 2-3 days.                           - Repeat colonoscopy in 10 years for screening                            purposes.                           - Return to my office at appointment to be                            scheduled. Henry L. Myrtie Neitheranis, MD 12/15/2018 2:09:04 PM This report has been  signed electronically.

## 2018-12-15 NOTE — Op Note (Signed)
[facility] Patient Name: Elijah Guerra Procedure Date: 12/15/2018 1:10 PM MRN: [id number] Endoscopist: Sherilyn Cooter L. Myrtie Neither , MD Age: 48 Referring MD:  Date of Birth: 1971-03-14 Gender: Male Account #: [id number] Procedure:                Upper GI endoscopy Indications:              Suspected esophageal reflux (overnight/early AM                            episodes of severe regurgitation without daytime                            heartburn) Medicines:                Monitored Anesthesia Care Procedure:                Pre-Anesthesia Assessment:                           - Prior to the procedure, a History and Physical                            was performed, and patient medications and                            allergies were reviewed. The patient's tolerance of                            previous anesthesia was also reviewed. The risks                            and benefits of the procedure and the sedation                            options and risks were discussed with the patient.                            All questions were answered, and informed consent                            was obtained. Prior Anticoagulants: The patient has                            taken no previous anticoagulant or antiplatelet                            agents. ASA Grade Assessment: II - A patient with                            mild systemic disease. After reviewing the risks                            and benefits, the patient was deemed in  satisfactory condition to undergo the procedure.                           After obtaining informed consent, the endoscope was                            passed under direct vision. Throughout the                            procedure, the patient's blood pressure, pulse, and                            oxygen saturations were monitored continuously. The                            Endoscope was introduced through the mouth, and                           advanced to the second part of duodenum. The upper                            GI endoscopy was accomplished without difficulty.                            The patient tolerated the procedure fairly well. Scope In: Scope Out: Findings:                 The larynx was normal.                           The esophagus was normal.                           Patchy mildly erythematous mucosa was found in the                            prepyloric region of the stomach. Biopsies were                            taken with a cold forceps for Helicobacter pylori                            testing using CLOtest.                           The exam of the stomach was otherwise normal,                            including on retroflexion.                           The examined duodenum was normal. Complications:            No immediate complications. Estimated Blood Loss:     Estimated blood loss was minimal. Impression:               -  Normal larynx.                           - Normal esophagus.                           - Erythematous mucosa in the prepyloric region of                            the stomach. Biopsied.                           - Normal examined duodenum. Recommendation:           - Patient has a contact number available for                            emergencies. The signs and symptoms of potential                            delayed complications were discussed with the                            patient. Return to normal activities tomorrow.                            Written discharge instructions were provided to the                            patient.                           - Resume previous diet.                           - Continue present medications.                           - Await pathology results.                           - See the other procedure note for documentation of                            additional recommendations.                            - Follow up in my office to be scheduled.                           - Follow an antireflux regimen indefinitely. Susi Goslin L. Myrtie Neither, MD 12/15/2018 2:05:41 PM This report has been signed electronically.

## 2018-12-15 NOTE — Progress Notes (Signed)
PT taken to PACU. Monitors in place. VSS. Report given to RN. 

## 2018-12-15 NOTE — Patient Instructions (Signed)
Continue present medications. Follow an antireflux regimen indefinitely. Please read handouts provided.  Prucalopride 2 mg tablet: one half tablet by mouth every 2-3 days.       YOU HAD AN ENDOSCOPIC PROCEDURE TODAY AT THE [facility]:   Refer to the procedure report that was given to you for any specific questions about what was found during the examination.  If the procedure report does not answer your questions, please call your gastroenterologist to clarify.  If you requested that your care partner not be given the details of your procedure findings, then the procedure report has been included in a sealed envelope for you to review at your convenience later.  YOU SHOULD EXPECT: Some feelings of bloating in the abdomen. Passage of more gas than usual.  Walking can help get rid of the air that was put into your GI tract during the procedure and reduce the bloating. If you had a lower endoscopy (such as a colonoscopy or flexible sigmoidoscopy) you may notice spotting of blood in your stool or on the toilet paper. If you underwent a bowel prep for your procedure, you may not have a normal bowel movement for a few days.  Please Note:  You might notice some irritation and congestion in your nose or some drainage.  This is from the oxygen used during your procedure.  There is no need for concern and it should clear up in a day or so.  SYMPTOMS TO REPORT IMMEDIATELY:   Following lower endoscopy (colonoscopy or flexible sigmoidoscopy):  Excessive amounts of blood in the stool  Significant tenderness or worsening of abdominal pains  Swelling of the abdomen that is new, acute  Fever of 100F or higher   Following upper endoscopy (EGD)  Vomiting of blood or coffee ground material  New chest pain or pain under the shoulder blades  Painful or persistently difficult swallowing  New shortness of breath  Fever of 100F or higher  Black, tarry-looking stools  For urgent or emergent  issues, a gastroenterologist can be reached at any hour by calling [phone].   DIET:  We do recommend a small meal at first, but then you may proceed to your regular diet.  Drink plenty of fluids but you should avoid alcoholic beverages for 24 hours.  ACTIVITY:  You should plan to take it easy for the rest of today and you should NOT DRIVE or use heavy machinery until tomorrow (because of the sedation medicines used during the test).    FOLLOW UP: Our staff will call the number listed on your records the next business day following your procedure to check on you and address any questions or concerns that you may have regarding the information given to you following your procedure. If we do not reach you, we will leave a message.  However, if you are feeling well and you are not experiencing any problems, there is no need to return our call.  We will assume that you have returned to your regular daily activities without incident.  If any biopsies were taken you will be contacted by phone or by letter within the next 1-3 weeks.  Please call us at [phone] if you have not heard about the biopsies in 3 weeks.    SIGNATURES/CONFIDENTIALITY: You and/or your care partner have signed paperwork which will be entered into your electronic medical record.  These signatures attest to the fact that that the information above on your After Visit Summary has been reviewed  and is understood.  Full responsibility of the confidentiality of this discharge information lies with you and/or your care-partner. 

## 2018-12-16 ENCOUNTER — Telehealth: Payer: Self-pay

## 2018-12-16 LAB — HELICOBACTER PYLORI SCREEN-BIOPSY: UREASE: NEGATIVE

## 2018-12-16 NOTE — Telephone Encounter (Signed)
  Follow up Call-  Call back number 12/15/2018  Post procedure Call Back phone  # [phone]  Permission to leave phone message Yes  Some recent data might be hidden     Patient questions:  Do you have a fever, pain , or abdominal swelling? No. Pain Score  0 *  Have you tolerated food without any problems? Yes.    Have you been able to return to your normal activities? Yes.    Do you have any questions about your discharge instructions: Diet   No. Medications  No. Follow up visit  No.  Do you have questions or concerns about your Care? No.  Actions: * If pain score is 4 or above: No action needed, pain <4.  Patient states it feels like a lump in his throat. No difficulty swallowing or breathing. No swelling or pain. Instructed to gargle with salt and water and call back if symptoms worsens.

## 2018-12-17 ENCOUNTER — Other Ambulatory Visit: Payer: Self-pay

## 2018-12-17 MED ORDER — LINACLOTIDE 145 MCG PO CAPS: 145.0000 ug | ORAL_CAPSULE | Freq: Every day | ORAL | 0 refills | Status: DC

## 2019-01-07 ENCOUNTER — Other Ambulatory Visit (INDEPENDENT_AMBULATORY_CARE_PROVIDER_SITE_OTHER): Payer: BLUE CROSS/BLUE SHIELD

## 2019-01-07 ENCOUNTER — Ambulatory Visit: Payer: BLUE CROSS/BLUE SHIELD | Admitting: Gastroenterology

## 2019-01-07 ENCOUNTER — Encounter: Payer: Self-pay | Admitting: Gastroenterology

## 2019-01-07 VITALS — BP 116/82 | HR 76 | Ht 63.75 in | Wt 189.2 lb

## 2019-01-07 DIAGNOSIS — K5909 Other constipation: Secondary | ICD-10-CM

## 2019-01-07 DIAGNOSIS — K219 Gastro-esophageal reflux disease without esophagitis: Secondary | ICD-10-CM

## 2019-01-07 LAB — CALCIUM: Calcium: 9.2 mg/dL (ref 8.4–10.5)

## 2019-01-07 LAB — TSH: TSH: 1.55 u[IU]/mL (ref 0.35–4.50)

## 2019-01-07 NOTE — Patient Instructions (Addendum)
Miralax one capful/packet  twice daily for 2 days .  Then resume prucalopride 2mg  tablet - take one daily.  Let us know how that is working.  We have given you samples of the following medication to take:  Motegrity 2 mg one a day Lot [id number] exp 09-2019  Your provider has requested that you go to the basement level for lab work before leaving today. Press "B" on the elevator. The lab is located at the first door on the left as you exit the elevator.  It was a pleasure to see you today!  Dr. Myrtie Neitheranis

## 2019-01-07 NOTE — Progress Notes (Signed)
     [facility] Progress Note  Chief Complaint: GERD and constipation  Subjective  History:  I recently saw Elijah Guerra in office consultation for GERD and constipation.  I gave him samples of prucalopride, which he says was too powerful, causing him some loose stool.  He and I discussed this at the time of his recent procedures.  I recommended he take 1/2 tablet every 2 to 3 days.  His upper endoscopy was normal except for some mild gastric erythema, but biopsy negative for urease. Colonoscopy was normal except for small internal hemorrhoids.  He is not having hemorrhoidal bleeding.  He reports that he took the prucalopride half a tablet in the morning, then half a tablet later in the day, but was not getting results from that.  He has not had a bowel movement in a few days.  That makes him not want to eat, and he still feels bloated.  Nighttime reflux has improved with dietary changes, smaller portions for supper not eating within several hours of bedtime.  ROS: Cardiovascular:  no chest pain Respiratory: no dyspnea  The patient's Past Medical, Family and Social History were reviewed and are on file in the EMR.  Objective:  Med list reviewed  Current Outpatient Medications:  .  elvitegravir-cobicistat-emtricitabine-tenofovir (GENVOYA) 150-150-200-10 MG TABS tablet, Take 1 tablet by mouth daily with breakfast., Disp: 30 tablet, Rfl: 11 .  ketoconazole (NIZORAL) 2 % cream, Apply 1 application topically as needed., Disp: , Rfl:  .  linaclotide (LINZESS) 145 MCG CAPS capsule, Take 1 capsule (145 mcg total) by mouth daily before breakfast. (Patient not taking: Reported on 01/07/2019), Disp: 12 capsule, Rfl: 0   Vital signs in last 24 hrs: Vitals:   01/07/19 1601  BP: 116/82  Pulse: 76    Physical Exam  He is well-appearing  HEENT: sclera anicteric, oral mucosa moist without lesions  Neck: supple, no thyromegaly, JVD or lymphadenopathy  Cardiac: RRR without murmurs,  S1S2 heard, no peripheral edema  Pulm: clear to auscultation bilaterally, normal RR and effort noted  Abdomen: soft, no tenderness, with active bowel sounds. No guarding or palpable hepatosplenomegaly.  Skin; warm and dry, no jaundice or rash     @ASSESSMENTPLANBEGIN @ Assessment: Encounter Diagnoses  Name Primary?  . Chronic constipation Yes  . Gastroesophageal reflux disease without esophagitis    Nocturnal regurgitation that is lately better with diet and lifestyle change.  Some weight loss would also help reduce the symptoms.  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  First try with prucalopride had undesirable results, but then did not seem to be working when he was taking 1/2 tablet twice a day.  We discussed the nature of functional constipation and its limited available regimen.  Recommended 1 packet of MiraLAX twice daily for 2 days to try to relieve his current constipation, then a second try at prucalopride 2 mg once daily.  If that does not agree with him or does not work, we can try samples of Linzess.  I also sent him for a TSH and calcium level today to ensure no metabolic cause of constipation.   Total time 30 minutes, over half spent face-to-face with patient in counseling and coordination of care.   Elijah Guerra

## 2019-01-18 ENCOUNTER — Telehealth: Payer: Self-pay | Admitting: Gastroenterology

## 2019-01-18 MED ORDER — PRUCALOPRIDE SUCCINATE 1 MG PO TABS: 1.0000 | ORAL_TABLET | Freq: Every day | ORAL | 1 refills | Status: DC

## 2019-01-18 NOTE — Telephone Encounter (Signed)
Rx sent and patient messaged

## 2019-02-09 ENCOUNTER — Telehealth: Payer: Self-pay | Admitting: Gastroenterology

## 2019-02-09 MED ORDER — PLECANATIDE 3 MG PO TABS: 3.0000 mg | ORAL_TABLET | Freq: Every day | ORAL | 0 refills | Status: DC

## 2019-02-09 NOTE — Telephone Encounter (Signed)
I received a letter from his insurance that Motegrity denied.   They say he needs to try preferred formulary medicine first, which is Trulance.  I sent a prescription to his pharmacy, and made it a 14 day trial to see how it goes.

## 2019-02-10 ENCOUNTER — Other Ambulatory Visit: Payer: Self-pay

## 2019-02-10 MED ORDER — PLECANATIDE 3 MG PO TABS: 3.0000 mg | ORAL_TABLET | Freq: Every day | ORAL | 0 refills | Status: DC

## 2019-02-10 NOTE — Telephone Encounter (Addendum)
Insurance has denied the [employer]. Patient will come pick up and do a trial of the Trulance 3 mg tablets, one a day with or without food.   Medication Samples have been provided to the patient.  Drug name: Trulance       Strength: 3mg         Qty: 21  LOT: [id number]  Exp.Date: 04-2021  Dosing instructions: one a day with or without food.  The patient has been instructed regarding the correct time, dose, and frequency of taking this medication, including desired effects and most common side effects.

## 2019-02-25 NOTE — Telephone Encounter (Signed)
Left a message for the patient. I wanted to find out if the patient was happy with the Trulance before sending the prior auth.in.  Due to no communication returned I went ahead and sent the prior auth. Samples at the front desk for pick up, left a detailed voicemail for the patient to make him aware.

## 2019-03-18 DIAGNOSIS — L400 Psoriasis vulgaris: Secondary | ICD-10-CM | POA: Diagnosis not present

## 2019-07-26 DIAGNOSIS — Z20828 Contact with and (suspected) exposure to other viral communicable diseases: Secondary | ICD-10-CM | POA: Diagnosis not present

## 2019-11-24 ENCOUNTER — Other Ambulatory Visit: Payer: Self-pay | Admitting: Infectious Diseases

## 2019-11-24 ENCOUNTER — Telehealth: Payer: Self-pay

## 2019-11-24 DIAGNOSIS — B20 Human immunodeficiency virus [HIV] disease: Secondary | ICD-10-CM

## 2019-11-24 NOTE — Telephone Encounter (Signed)
Received refill request from patients pharmacy for [facility]. Patient has not been in clinic 10/2018.  Patient needs to call office to set up an appointment with our office. Attempted to call patient to schedule the appointment, however was unable to speak with the patient nor leave a voicemail. Also sent patient MyChart message. Elijah Guerra

## 2019-12-26 ENCOUNTER — Other Ambulatory Visit: Payer: Self-pay | Admitting: Infectious Diseases

## 2019-12-26 DIAGNOSIS — B20 Human immunodeficiency virus [HIV] disease: Secondary | ICD-10-CM

## 2019-12-27 ENCOUNTER — Telehealth: Payer: Self-pay

## 2019-12-27 NOTE — Telephone Encounter (Signed)
Attempted to call patient to schedule overdue appointment with office. Unable to reach patient at this time; nor leave voicemail. Multiple attempts have been made to bring patient into office. Lorenso Courier, [place]

## 2020-01-03 ENCOUNTER — Telehealth: Payer: Self-pay | Admitting: *Deleted

## 2020-01-03 DIAGNOSIS — B20 Human immunodeficiency virus [HIV] disease: Secondary | ICD-10-CM

## 2020-01-03 MED ORDER — GENVOYA 150-150-200-10 MG PO TABS: 1.0000 | ORAL_TABLET | Freq: Every day | ORAL | 0 refills | Status: DC

## 2020-01-03 NOTE — Telephone Encounter (Signed)
Patient called. Updated demographics with mobile number and new address effective 01/04/20.  Sent in 30 days of Genvoya to get him to his appointment (labs 2/15, follow up 3/2).  He has not missed a dose, knows he has to keep this appointment for future refills. Andree Coss, RN

## 2020-01-04 DIAGNOSIS — L400 Psoriasis vulgaris: Secondary | ICD-10-CM | POA: Diagnosis not present

## 2020-01-04 DIAGNOSIS — B079 Viral wart, unspecified: Secondary | ICD-10-CM | POA: Diagnosis not present

## 2020-01-04 DIAGNOSIS — L280 Lichen simplex chronicus: Secondary | ICD-10-CM | POA: Diagnosis not present

## 2020-01-04 DIAGNOSIS — L821 Other seborrheic keratosis: Secondary | ICD-10-CM | POA: Diagnosis not present

## 2020-01-10 ENCOUNTER — Other Ambulatory Visit: Payer: Self-pay

## 2020-01-10 ENCOUNTER — Other Ambulatory Visit: Payer: BC Managed Care – PPO

## 2020-01-10 DIAGNOSIS — B20 Human immunodeficiency virus [HIV] disease: Secondary | ICD-10-CM

## 2020-01-11 LAB — T-HELPER CELL (CD4) - (RCID CLINIC ONLY)
CD4 % Helper T Cell: 32 % — ABNORMAL LOW (ref 33–65)
CD4 T Cell Abs: 598 /uL (ref 400–1790)

## 2020-01-13 LAB — CBC WITH DIFFERENTIAL/PLATELET
Absolute Monocytes: 592 cells/uL (ref 200–950)
Basophils Absolute: 31 cells/uL (ref 0–200)
Basophils Relative: 0.6 %
Eosinophils Absolute: 122 cells/uL (ref 15–500)
Eosinophils Relative: 2.4 %
HCT: 42.7 % (ref 38.5–50.0)
Hemoglobin: 14.4 g/dL (ref 13.2–17.1)
Lymphs Abs: 1913 cells/uL (ref 850–3900)
MCH: 32.2 pg (ref 27.0–33.0)
MCHC: 33.7 g/dL (ref 32.0–36.0)
MCV: 95.5 fL (ref 80.0–100.0)
MPV: 9.9 fL (ref 7.5–12.5)
Monocytes Relative: 11.6 %
Neutro Abs: 2443 cells/uL (ref 1500–7800)
Neutrophils Relative %: 47.9 %
Platelets: 239 10*3/uL (ref 140–400)
RBC: 4.47 10*6/uL (ref 4.20–5.80)
RDW: 11 % (ref 11.0–15.0)
Total Lymphocyte: 37.5 %
WBC: 5.1 10*3/uL (ref 3.8–10.8)

## 2020-01-13 LAB — COMPREHENSIVE METABOLIC PANEL
AG Ratio: 1.2 (calc) (ref 1.0–2.5)
ALT: 36 U/L (ref 9–46)
AST: 28 U/L (ref 10–40)
Albumin: 4.6 g/dL (ref 3.6–5.1)
Alkaline phosphatase (APISO): 68 U/L (ref 36–130)
BUN: 13 mg/dL (ref 7–25)
CO2: 27 mmol/L (ref 20–32)
Calcium: 9.6 mg/dL (ref 8.6–10.3)
Chloride: 102 mmol/L (ref 98–110)
Creat: 1.24 mg/dL (ref 0.60–1.35)
Globulin: 3.7 g/dL (calc) (ref 1.9–3.7)
Glucose, Bld: 122 mg/dL — ABNORMAL HIGH (ref 65–99)
Potassium: 3.6 mmol/L (ref 3.5–5.3)
Sodium: 139 mmol/L (ref 135–146)
Total Bilirubin: 0.6 mg/dL (ref 0.2–1.2)
Total Protein: 8.3 g/dL — ABNORMAL HIGH (ref 6.1–8.1)

## 2020-01-13 LAB — HIV-1 RNA QUANT-NO REFLEX-BLD
HIV 1 RNA Quant: 21 copies/mL — ABNORMAL HIGH
HIV-1 RNA Quant, Log: 1.32 Log copies/mL — ABNORMAL HIGH

## 2020-01-25 ENCOUNTER — Encounter: Payer: Self-pay | Admitting: Infectious Diseases

## 2020-01-25 ENCOUNTER — Other Ambulatory Visit: Payer: Self-pay

## 2020-01-25 ENCOUNTER — Ambulatory Visit (INDEPENDENT_AMBULATORY_CARE_PROVIDER_SITE_OTHER): Payer: BC Managed Care – PPO | Admitting: Infectious Diseases

## 2020-01-25 VITALS — BP 129/84 | HR 85 | Wt 194.0 lb

## 2020-01-25 DIAGNOSIS — K5900 Constipation, unspecified: Secondary | ICD-10-CM

## 2020-01-25 DIAGNOSIS — Z79899 Other long term (current) drug therapy: Secondary | ICD-10-CM | POA: Diagnosis not present

## 2020-01-25 DIAGNOSIS — B20 Human immunodeficiency virus [HIV] disease: Secondary | ICD-10-CM

## 2020-01-25 DIAGNOSIS — Z23 Encounter for immunization: Secondary | ICD-10-CM

## 2020-01-25 DIAGNOSIS — Z113 Encounter for screening for infections with a predominantly sexual mode of transmission: Secondary | ICD-10-CM | POA: Diagnosis not present

## 2020-01-25 NOTE — Progress Notes (Signed)
   Subjective:    Patient ID: Elijah Guerra, male    DOB: 1970/12/15, 49 y.o.   MRN: [id number]  HPI 49yo M with hx of HIV+, on atripla. He was changed atripla --> genvoya 09-2016. No problems with ART.  He also has a hx of constipation- worse over last 6 months until last week when it has been "the best of my life". Has taken trulance.  Has been seen by derm for excema. Using oint and cream.  Also with nasal soreness, rawness. Swollen. No rhinorrhea.   HIV 1 RNA Quant (copies/mL)  Date Value  01/10/2020 21 (H)  11/03/2018 <20 NOT DETECTED  07/14/2018 41 (H)   CD4 T Cell Abs (/uL)  Date Value  01/10/2020 598  07/14/2018 800  09/08/2017 760    Review of Systems  Constitutional: Negative for appetite change and unexpected weight change.  HENT: Positive for sinus pain. Negative for postnasal drip and rhinorrhea.   Gastrointestinal: Positive for constipation. Negative for diarrhea.  Genitourinary: Positive for decreased urine volume and difficulty urinating (hesitancy).  Please see HPI. All other systems reviewed and negative.      Objective:   Physical Exam Vitals reviewed.  Constitutional:      Appearance: Normal appearance.  HENT:     Mouth/Throat:     Mouth: Mucous membranes are moist.     Pharynx: No oropharyngeal exudate.  Eyes:     Extraocular Movements: Extraocular movements intact.     Pupils: Pupils are equal, round, and reactive to light.  Cardiovascular:     Rate and Rhythm: Normal rate and regular rhythm.  Pulmonary:     Effort: Pulmonary effort is normal.     Breath sounds: Normal breath sounds.  Abdominal:     General: Bowel sounds are normal. There is no distension.     Palpations: Abdomen is soft.     Tenderness: There is no abdominal tenderness.  Musculoskeletal:        General: Normal range of motion.     Cervical back: Normal range of motion and neck supple.  Neurological:     General: No focal deficit present.     Mental Status: He is alert.   Psychiatric:        Mood and Affect: Mood normal.           Assessment & Plan:

## 2020-01-25 NOTE — Addendum Note (Signed)
Addended by: Rosanna Randy on: 01/25/2020 04:20 PM   Modules accepted: Orders

## 2020-01-25 NOTE — Assessment & Plan Note (Signed)
continues with prn trulance.

## 2020-01-25 NOTE — Assessment & Plan Note (Signed)
Doing well  No change in art Mening and Hep A #21 (over) due Flu shot defered by pt.  Wants COVID vaccine, waiting list.  Offered/refused condoms.  rtc in 9 months.

## 2020-02-05 ENCOUNTER — Other Ambulatory Visit: Payer: Self-pay | Admitting: Infectious Diseases

## 2020-02-05 DIAGNOSIS — B20 Human immunodeficiency virus [HIV] disease: Secondary | ICD-10-CM

## 2020-05-15 DIAGNOSIS — M25532 Pain in left wrist: Secondary | ICD-10-CM | POA: Diagnosis not present

## 2020-05-15 DIAGNOSIS — S63502A Unspecified sprain of left wrist, initial encounter: Secondary | ICD-10-CM | POA: Diagnosis not present

## 2020-06-23 ENCOUNTER — Ambulatory Visit
Admission: EM | Admit: 2020-06-23 | Discharge: 2020-06-23 | Disposition: A | Discharge status: Home / Self Care | Payer: BC Managed Care – PPO | Attending: Emergency Medicine | Admitting: Emergency Medicine

## 2020-06-23 ENCOUNTER — Other Ambulatory Visit: Payer: Self-pay

## 2020-06-23 DIAGNOSIS — R197 Diarrhea, unspecified: Secondary | ICD-10-CM

## 2020-06-23 MED ORDER — AZITHROMYCIN 250 MG PO TABS: 250.0000 mg | ORAL_TABLET | Freq: Every day | ORAL | 0 refills | Status: DC

## 2020-06-23 NOTE — ED Provider Notes (Signed)
[facility]    CSN: [id number] Arrival date & time: 06/23/20  1208      History   Chief Complaint Chief Complaint  Patient presents with  . Abdominal Pain  . Diarrhea    HPI Elijah Guerra is a 49 y.o. male.   Subjective:   Elijah Guerra is a 49 y.o. male who presents for evaluation of diarrhea. Onset of diarrhea was a week ago. Diarrhea is occurring approximately 9 times per day. Patient describes diarrhea as bloody and watery. Diarrhea has been associated with abdominal pain described as aching and stabbing.  Patient denies fever, illness in household contacts, recent antibiotic use, recent camping, recent travel, significant abdominal pain, unintentional weight loss. Evaluation to date: colonoscopy: last year, sig for internal hemorrhoids. Treatment to date: supportive at home.  The following portions of the patient's history were reviewed and updated as appropriate: allergies, current medications, past family history, past medical history, past social history, past surgical history and problem list.     Plan:  Discussed the appropriate management of diarrhea. Empiric antibiotics. Follow up in 7 days with his GI provider or as needed.      Past Medical History:  Diagnosis Date  . GERD (gastroesophageal reflux disease)   . HIV (human immunodeficiency virus infection) [facility])     Patient Active Problem List   Diagnosis Date Noted  . Healthcare maintenance 11/06/2018  . Hepatitis B immune 01/22/2017  . Skin nodule 09/04/2015  . Contact dermatitis 09/15/2014  . Hyperglycemia 08/30/2013  . Erectile dysfunction 08/30/2013  . Arthritis 02/12/2012  . GERD 12/29/2009  . Constipation 12/29/2009  . VOMITING 12/29/2009  . PROTEINURIA, MILD 04/01/2007  . Human immunodeficiency virus (HIV) disease (HCC) 01/01/2007  . DISORDER, TOBACCO USE 01/01/2007    Past Surgical History:  Procedure Laterality Date  . HERNIA REPAIR     childhood       Home  Medications    Prior to Admission medications   Medication Sig Start Date End Date Taking? Authorizing Provider  azithromycin (ZITHROMAX) 250 MG tablet Take 1 tablet (250 mg total) by mouth daily. Take first 2 tablets together, then 1 every day until finished. 06/23/20   Hall-Potvin, [place], PA-C  betamethasone dipropionate (DIPROLENE) 0.05 % ointment APPLY EXTERNALLY TO THE AFFECTED AREA TWICE DAILY THEN AS NEEDED FLARE 01/04/20   [provider]  fluocinonide cream (LIDEX) 0.05 % APPLY EXTERNALLY TO THE AFFECTED AREA TWICE DAILY THEN AS NEEDED FLARE / ITCHING 01/05/20   [provider]  GENVOYA 150-150-200-10 MG TABS tablet TAKE 1 TABLET BY MOUTH DAILY WITH BREAKFAST 02/07/20   Ginnie Smart, MD  ketoconazole (NIZORAL) 2 % cream Apply 1 application topically as needed.    [provider]  Plecanatide (TRULANCE) 3 MG TABS Take 3 mg by mouth daily. 02/10/19   Sherrilyn Rist, MD  omeprazole (PRILOSEC OTC) 20 MG tablet Take 20 mg by mouth daily.    07/22/12  [provider]    Family History Family History  Problem Relation Age of Onset  . Heart disease Mother   . Prostate cancer Father   . Colon cancer Neg Hx   . Esophageal cancer Neg Hx   . Rectal cancer Neg Hx   . Stomach cancer Neg Hx     Social History Social History   Tobacco Use  . Smoking status: Former Smoker    Packs/day: 0.30    Types: Cigars  . Smokeless tobacco: Never Used  . Tobacco comment:  congratulated!!  Substance Use Topics  . Alcohol use: No    Alcohol/week: 0.0 standard drinks  . Drug use: No     Allergies   Patient has no known allergies.   Review of Systems Review of Systems  Constitutional: Negative for activity change and appetite change.  Gastrointestinal: Positive for abdominal pain, blood in stool and diarrhea. Negative for rectal pain and vomiting.  Genitourinary: Negative for difficulty urinating, frequency and hematuria.  All other systems reviewed  and are negative.    Physical Exam Triage Vital Signs ED Triage Vitals  Enc Vitals Group     BP 06/23/20 1229 121/81     Pulse Rate 06/23/20 1229 96     Resp 06/23/20 1229 18     Temp 06/23/20 1229 99.1 F (37.3 C)     Temp Source 06/23/20 1229 Oral     SpO2 06/23/20 1229 95 %     Weight --      Height --      Head Circumference --      Peak Flow --      Pain Score 06/23/20 1228 8     Pain Loc --      Pain Edu? --      Excl. in GC? --    No data found.  Updated Vital Signs BP 121/81 (BP Location: Right Arm)   Pulse 96   Temp 99.1 F (37.3 C) (Oral)   Resp 18   SpO2 95%   Visual Acuity Right Eye Distance:   Left Eye Distance:   Bilateral Distance:    Right Eye Near:   Left Eye Near:    Bilateral Near:     Physical Exam Constitutional:      General: He is not in acute distress.    Appearance: He is well-developed. He is obese. He is not ill-appearing.  HENT:     Head: Normocephalic and atraumatic.     Mouth/Throat:     Pharynx: Oropharynx is clear.  Eyes:     General: No scleral icterus.    Pupils: Pupils are equal, round, and reactive to light.  Cardiovascular:     Rate and Rhythm: Normal rate.  Pulmonary:     Effort: Pulmonary effort is normal. No respiratory distress.     Breath sounds: No wheezing.  Abdominal:     General: Abdomen is flat. Bowel sounds are normal.     Palpations: Abdomen is soft. There is no hepatomegaly or splenomegaly.     Tenderness: There is abdominal tenderness in the left upper quadrant and left lower quadrant.  Skin:    Coloration: Skin is not jaundiced or pale.  Neurological:     Mental Status: He is alert and oriented to person, place, and time.      UC Treatments / Results  Labs (all labs ordered are listed, but only abnormal results are displayed) Labs Reviewed - No data to display  EKG   Radiology No results found.  Procedures Procedures (including critical care time)  Medications Ordered in  UC Medications - No data to display  Initial Impression / Assessment and Plan / UC Course  I have reviewed the triage vital signs and the nursing notes.  Pertinent labs & imaging results that were available during my care of the patient were reviewed by me and considered in my medical decision making (see chart for details).     Plan: Afebrile, nontoxic, hydrated and tolerating PO fluids. Discussed the appropriate management of diarrhea. Empiric  antibiotics. Follow up in 7 days with his GI provider or as needed. Final Clinical Impressions(s) / UC Diagnoses   Final diagnoses:  Diarrhea, unspecified type     Discharge Instructions     Push fluids. Take antibiotic as prescribed. Follow up with GI doctor in 1 week. Go to ER for severe abdominal pain, worsening bleeding, fever.    ED Prescriptions    Medication Sig Dispense Auth. Provider   azithromycin (ZITHROMAX) 250 MG tablet Take 1 tablet (250 mg total) by mouth daily. Take first 2 tablets together, then 1 every day until finished. 6 tablet Hall-Potvin, [place], PA-C     PDMP not reviewed this encounter.   Hall-Potvin, [place], [place] 06/23/20 1337

## 2020-06-23 NOTE — Discharge Instructions (Addendum)
Push fluids. Take antibiotic as prescribed. Follow up with GI doctor in 1 week. Go to ER for severe abdominal pain, worsening bleeding, fever.

## 2020-06-23 NOTE — ED Triage Notes (Signed)
Pt presents with lower abdominal pain, bloated and diarrhea x 8 days. States the abdominal pain happens at any time with not triggers. Pt used the restroom over 7 times a day. Denies fever, chills, nausea.

## 2020-07-03 DIAGNOSIS — R110 Nausea: Secondary | ICD-10-CM | POA: Diagnosis not present

## 2020-07-03 DIAGNOSIS — R197 Diarrhea, unspecified: Secondary | ICD-10-CM | POA: Diagnosis not present

## 2020-07-03 DIAGNOSIS — R109 Unspecified abdominal pain: Secondary | ICD-10-CM | POA: Diagnosis not present

## 2020-07-03 DIAGNOSIS — R140 Abdominal distension (gaseous): Secondary | ICD-10-CM | POA: Diagnosis not present

## 2020-08-03 ENCOUNTER — Ambulatory Visit: Payer: BC Managed Care – PPO | Admitting: Infectious Diseases

## 2020-08-03 ENCOUNTER — Telehealth: Payer: Self-pay | Admitting: Gastroenterology

## 2020-08-03 ENCOUNTER — Other Ambulatory Visit (HOSPITAL_COMMUNITY)
Admission: RE | Admit: 2020-08-03 | Discharge: 2020-08-03 | Disposition: A | Discharge status: Home / Self Care | Payer: BC Managed Care – PPO | Source: Ambulatory Visit | Attending: Infectious Diseases | Admitting: Infectious Diseases

## 2020-08-03 ENCOUNTER — Encounter: Payer: Self-pay | Admitting: Infectious Diseases

## 2020-08-03 ENCOUNTER — Other Ambulatory Visit: Payer: Self-pay

## 2020-08-03 VITALS — BP 118/79 | HR 90 | Temp 98.5°F | Ht 66.0 in | Wt 177.0 lb

## 2020-08-03 DIAGNOSIS — B20 Human immunodeficiency virus [HIV] disease: Secondary | ICD-10-CM | POA: Diagnosis not present

## 2020-08-03 DIAGNOSIS — K5900 Constipation, unspecified: Secondary | ICD-10-CM | POA: Diagnosis not present

## 2020-08-03 DIAGNOSIS — Z113 Encounter for screening for infections with a predominantly sexual mode of transmission: Secondary | ICD-10-CM | POA: Insufficient documentation

## 2020-08-03 DIAGNOSIS — Z79899 Other long term (current) drug therapy: Secondary | ICD-10-CM

## 2020-08-03 NOTE — Telephone Encounter (Signed)
I rec'd a message from Dr. Ninetta Lights of Infectious Disease regarding this mutual patient who has had > 6 weeks of altered bowel habits and blood in the stool. (patient had not contacted [place] - was seen in ID clinic today)  Dr. Ninetta Lights will arrange some stool studies and patient needs an OV with me soon.  Looks like there is currently an opening at 1:20 on Monday, Sept 20th.  Please contact patient and ask him to come at 1:10PM that day for check-in.  - HD

## 2020-08-03 NOTE — Assessment & Plan Note (Signed)
He is now having very abn stool Will try to get him into GI asap.

## 2020-08-03 NOTE — Progress Notes (Signed)
   Subjective:    Patient ID: Elijah Guerra, male    DOB: Oct 17, 1971, 49 y.o.   MRN: [id number]  HPI 49yo M with hx of HIV+, on atripla (first visit 03-2001?).He was changed atripla --> genvoya 09-2016. No problems with ART.  Was seen in UC/ED for blood in stool this summer. States he has been "incredibly sick" for several months this summer. He has not been able to get into his PCP or GI (09-02-20).  Had low grade fever for weeks (< 100), nauseated, stool have been "whitish-pinkish... like insulation... breaks apart like cotton candy".  Has not had regular BM in over 2 months.  No change in diet, no travel this summer, no raw food. No sex partners. Tried apple cider vinegar to see if it would help.  Normal colon last year.  Has lost 30# (regained 5-10).   Has gotten COVID vax (April).    HIV 1 RNA Quant (copies/mL)  Date Value  01/10/2020 21 (H)  11/03/2018 <20 NOT DETECTED  07/14/2018 41 (H)   CD4 T Cell Abs (/uL)  Date Value  01/10/2020 598  07/14/2018 800  09/08/2017 760    Review of Systems  Constitutional: Positive for appetite change, chills, fever and unexpected weight change.  Gastrointestinal: Positive for abdominal distention, abdominal pain, blood in stool, constipation, diarrhea and nausea.  Genitourinary: Negative for difficulty urinating.  Please see HPI. All other systems reviewed and negative.      Objective:   Physical Exam Vitals reviewed.  Constitutional:      General: He is not in acute distress.    Appearance: Normal appearance. He is not ill-appearing or toxic-appearing.  HENT:     Mouth/Throat:     Mouth: Mucous membranes are moist.     Pharynx: No oropharyngeal exudate.  Eyes:     Extraocular Movements: Extraocular movements intact.     Pupils: Pupils are equal, round, and reactive to light.  Cardiovascular:     Rate and Rhythm: Normal rate and regular rhythm.  Pulmonary:     Effort: Pulmonary effort is normal.     Breath sounds: Normal  breath sounds.  Abdominal:     General: Bowel sounds are normal. There is distension.     Palpations: Abdomen is soft.     Tenderness: There is no abdominal tenderness. There is no guarding.  Musculoskeletal:        General: Normal range of motion.     Cervical back: Normal range of motion and neck supple.     Right lower leg: No edema.     Left lower leg: No edema.  Neurological:     General: No focal deficit present.     Mental Status: He is alert.  Psychiatric:        Mood and Affect: Mood normal.           Assessment & Plan:

## 2020-08-03 NOTE — Assessment & Plan Note (Signed)
Will check his labs today Wants to defer flu vax for now Offered/refused condoms.  Will see him back in 4-6 weeks.

## 2020-08-04 LAB — T-HELPER CELL (CD4) - (RCID CLINIC ONLY)
CD4 % Helper T Cell: 34 % (ref 33–65)
CD4 T Cell Abs: 556 /uL (ref 400–1790)

## 2020-08-04 NOTE — Telephone Encounter (Signed)
Patient has been contacted and scheduled

## 2020-08-05 LAB — LIPID PANEL
Cholesterol: 173 mg/dL (ref <200)
HDL: 42 mg/dL (ref >=40)
LDL Cholesterol (Calc): 102 mg/dL (calc) — ABNORMAL HIGH
Non-HDL Cholesterol (Calc): 131 mg/dL (calc) — ABNORMAL HIGH (ref <130)
Total CHOL/HDL Ratio: 4.1 (calc) (ref <5.0)
Triglycerides: 173 mg/dL — ABNORMAL HIGH (ref <150)

## 2020-08-05 LAB — CBC WITH DIFFERENTIAL/PLATELET
Absolute Monocytes: 738 cells/uL (ref 200–950)
Basophils Absolute: 21 cells/uL (ref 0–200)
Basophils Relative: 0.3 %
Eosinophils Absolute: 407 cells/uL (ref 15–500)
Eosinophils Relative: 5.9 %
HCT: 37 % — ABNORMAL LOW (ref 38.5–50.0)
Hemoglobin: 11.9 g/dL — ABNORMAL LOW (ref 13.2–17.1)
Lymphs Abs: 1766 cells/uL (ref 850–3900)
MCH: 31 pg (ref 27.0–33.0)
MCHC: 32.2 g/dL (ref 32.0–36.0)
MCV: 96.4 fL (ref 80.0–100.0)
MPV: 10.1 fL (ref 7.5–12.5)
Monocytes Relative: 10.7 %
Neutro Abs: 3968 cells/uL (ref 1500–7800)
Neutrophils Relative %: 57.5 %
Platelets: 384 10*3/uL (ref 140–400)
RBC: 3.84 10*6/uL — ABNORMAL LOW (ref 4.20–5.80)
RDW: 12.3 % (ref 11.0–15.0)
Total Lymphocyte: 25.6 %
WBC: 6.9 10*3/uL (ref 3.8–10.8)

## 2020-08-05 LAB — COMPREHENSIVE METABOLIC PANEL
AG Ratio: 1.1 (calc) (ref 1.0–2.5)
ALT: 19 U/L (ref 9–46)
AST: 28 U/L (ref 10–40)
Albumin: 3.9 g/dL (ref 3.6–5.1)
Alkaline phosphatase (APISO): 76 U/L (ref 36–130)
BUN: 13 mg/dL (ref 7–25)
CO2: 25 mmol/L (ref 20–32)
Calcium: 9 mg/dL (ref 8.6–10.3)
Chloride: 104 mmol/L (ref 98–110)
Creat: 1.17 mg/dL (ref 0.60–1.35)
Globulin: 3.4 g/dL (calc) (ref 1.9–3.7)
Glucose, Bld: 93 mg/dL (ref 65–99)
Potassium: 4 mmol/L (ref 3.5–5.3)
Sodium: 141 mmol/L (ref 135–146)
Total Bilirubin: 0.5 mg/dL (ref 0.2–1.2)
Total Protein: 7.3 g/dL (ref 6.1–8.1)

## 2020-08-05 LAB — HIV-1 RNA QUANT-NO REFLEX-BLD
HIV 1 RNA Quant: <20 Copies/mL
HIV-1 RNA Quant, Log: <1.3 Log cps/mL

## 2020-08-05 LAB — RPR TITER: RPR Titer: 1:1 {titer} — ABNORMAL HIGH

## 2020-08-05 LAB — RPR: RPR Ser Ql: REACTIVE — AB

## 2020-08-05 LAB — FLUORESCENT TREPONEMAL AB(FTA)-IGG-BLD: Fluorescent Treponemal ABS: NONREACTIVE

## 2020-08-06 ENCOUNTER — Other Ambulatory Visit: Payer: Self-pay | Admitting: Infectious Diseases

## 2020-08-06 DIAGNOSIS — B20 Human immunodeficiency virus [HIV] disease: Secondary | ICD-10-CM

## 2020-08-07 ENCOUNTER — Other Ambulatory Visit: Payer: BC Managed Care – PPO

## 2020-08-07 ENCOUNTER — Other Ambulatory Visit: Payer: Self-pay

## 2020-08-07 ENCOUNTER — Telehealth: Payer: Self-pay | Admitting: *Deleted

## 2020-08-07 DIAGNOSIS — K5900 Constipation, unspecified: Secondary | ICD-10-CM

## 2020-08-07 DIAGNOSIS — Z79899 Other long term (current) drug therapy: Secondary | ICD-10-CM

## 2020-08-07 LAB — URINE CYTOLOGY ANCILLARY ONLY
Chlamydia: NEGATIVE
Comment: NEGATIVE
Comment: NORMAL
Neisseria Gonorrhea: NEGATIVE

## 2020-08-07 NOTE — Telephone Encounter (Signed)
-----   Message from [place] sent at 08/07/2020  8:40 AM EDT ----- Patient called this morning in regards to sample he was supposed to drop off on Friday but he didn't realize we close early on Friday's, he said he has had it in the fridge since Friday and was wanting to know if he needed to repeat or if he could just bring in the same sample. Told him I would reach out to a nurse, thanks!

## 2020-08-07 NOTE — Telephone Encounter (Signed)
Spoke with Jensen in lab.  Vonna Kotyk should be within 96 hours of collection, ova and parasite should be ok if brought in this morning. RN notified patient, he is bringing it to the front desk this morning.  Andree Coss, RN

## 2020-08-14 ENCOUNTER — Ambulatory Visit: Payer: BC Managed Care – PPO | Admitting: Gastroenterology

## 2020-08-14 ENCOUNTER — Encounter: Payer: Self-pay | Admitting: Gastroenterology

## 2020-08-14 ENCOUNTER — Other Ambulatory Visit: Payer: BC Managed Care – PPO

## 2020-08-14 VITALS — BP 122/70 | HR 93 | Ht 63.75 in | Wt 178.0 lb

## 2020-08-14 DIAGNOSIS — R194 Change in bowel habit: Secondary | ICD-10-CM | POA: Diagnosis not present

## 2020-08-14 DIAGNOSIS — K625 Hemorrhage of anus and rectum: Secondary | ICD-10-CM

## 2020-08-14 DIAGNOSIS — R1030 Lower abdominal pain, unspecified: Secondary | ICD-10-CM

## 2020-08-14 MED ORDER — PLENVU 140 G PO SOLR: ORAL | 0 refills | Status: DC

## 2020-08-14 NOTE — Progress Notes (Signed)
[facility] Progress Note  Chief Complaint: Change in bowel habits  Subjective  History: Patient was last seen February 2020 for chronic constipation and bloating as well as reflux manifest primarily as regurgitation.  Colonoscopy January 2020 normal to the cecum except for internal hemorrhoids.  EGD normal..  Linzess was not helpful, prucalopride was too strong.  Then recommended as needed MiraLAX. I received a message from Dr. Ninetta Lights after the patient recently followed up with him, and this described 2 to 3 months of bloody stool.  Dr. Ninetta Lights was going to have his clinical staff reach out to the patient for some stool studies, and chart notes indicate the patient collected it but did not get it to the lab in time. Patient was seen at [facility] urgent care on 06/23/2020 for this and prescribed azithromycin.  ________ Elijah Guerra tells me he was acutely sick about 10 to 14 days prior to the July 30 of ED visit.  He awoke with severe upper abdominal pain "doubling me over", had nausea and perhaps vomited most once.  He also had some loose stool for maybe a day afterwards.  Abdominal pain was severe and colicky for about 2 weeks.  The pain has slowly subsided moved down to the lower abdomen and now is just a general feeling of bloating with occasional cramping.  He describes his stool as formed, but pinkish and white "like cotton candy".  He has had some intermittent bright red blood that he attributes to his known hemorrhoids.  ROS: Cardiovascular:  no chest pain Respiratory: no dyspnea Denies dysphagia, odynophagia or weight loss. Remainder of systems negative except as above The patient's Past Medical, Family and Social History were reviewed and are on file in the EMR.  Objective:  Med list reviewed  Current Outpatient Medications:  .  betamethasone dipropionate (DIPROLENE) 0.05 % ointment, APPLY EXTERNALLY TO THE AFFECTED AREA TWICE DAILY THEN AS NEEDED FLARE, Disp: , Rfl:  .   fluocinonide cream (LIDEX) 0.05 %, APPLY EXTERNALLY TO THE AFFECTED AREA TWICE DAILY THEN AS NEEDED FLARE / ITCHING, Disp: , Rfl:  .  GENVOYA 150-150-200-10 MG TABS tablet, TAKE 1 TABLET BY MOUTH DAILY WITH BREAKFAST, Disp: 30 tablet, Rfl: 5 .  ketoconazole (NIZORAL) 2 % cream, Apply 1 application topically as needed., Disp: , Rfl:  .  Plecanatide (TRULANCE) 3 MG TABS, Take 3 mg by mouth daily., Disp: 21 tablet, Rfl: 0   Vital signs in last 24 hrs: Vitals:   08/14/20 1335  BP: 122/70  Pulse: 93  SpO2: 99%    Physical Exam  He is well-appearing  HEENT: sclera anicteric, oral mucosa moist without lesions  Neck: supple, no thyromegaly, JVD or lymphadenopathy  Cardiac: RRR without murmurs, S1S2 heard, no peripheral edema  Pulm: clear to auscultation bilaterally, normal RR and effort noted  Abdomen: soft, no tenderness, with active bowel sounds. No guarding or palpable hepatosplenomegaly.  Skin; warm and dry, no jaundice or rash  Labs:  CBC Latest Ref Rng & Units 08/03/2020 01/10/2020 07/14/2018  WBC 3.8 - 10.8 Thousand/uL 6.9 5.1 6.0  Hemoglobin 13.2 - 17.1 g/dL 11.9(L) 14.4 14.8  Hematocrit 38 - 50 % 37.0(L) 42.7 43.4  Platelets 140 - 400 Thousand/uL 384 239 205   CMP Latest Ref Rng & Units 08/03/2020 01/10/2020 01/07/2019  Glucose 65 - 99 mg/dL 93 578(I) -  BUN 7 - 25 mg/dL 13 13 -  Creatinine 6.96 - 1.35 mg/dL 2.95 2.84 -  Sodium 132 - 146 mmol/L  141 139 -  Potassium 3.5 - 5.3 mmol/L 4.0 3.6 -  Chloride 98 - 110 mmol/L 104 102 -  CO2 20 - 32 mmol/L 25 27 -  Calcium 8.6 - 10.3 mg/dL 9.0 9.6 9.2  Total Protein 6.1 - 8.1 g/dL 7.3 2.8(N) -  Total Bilirubin 0.2 - 1.2 mg/dL 0.5 0.6 -  Alkaline Phos 40 - 115 U/L - - -  AST 10 - 40 U/L 28 28 -  ALT 9 - 46 U/L 19 36 -   Recent CD4 = 556 ___________________________________________ Radiologic studies:   ____________________________________________ Other:   _____________________________________________ Assessment & Plan    Assessment: Encounter Diagnoses  Name Primary?  . Lower abdominal pain Yes  . Change in bowel habits   . Rectal bleeding     Acute onset abdominal pain followed shortly thereafter by change in bowel habits.  Pain subsiding and moving into the lower abdomen, bowel habit abnormality persists.  It does not sound like diarrhea, but is abnormal for him and not has not improved over the course of this illness.  The rectal bleeding has been intermittent and more so when he feels difficulty with bowel movements and has to strain.  That certainly does sound like bleeding from his known internal hemorrhoids.  Plan: Stool for C. difficile PCR and ova and parasites  Colonoscopy in about 2 weeks (to allow time for stool study results).  If stool studies positive, most likely cancel colonoscopy. He was agreeable after discussion of procedure and risks.  The benefits and risks of the planned procedure were described in detail with the patient or (when appropriate) their health care proxy.  Risks were outlined as including, but not limited to, bleeding, infection, perforation, adverse medication reaction leading to cardiac or pulmonary decompensation, pancreatitis (if ERCP).  The limitation of incomplete mucosal visualization was also discussed.  No guarantees or warranties were given.    35 minutes were spent on this encounter (including chart review, history/exam, counseling/coordination of care, and documentation)  Elijah Guerra

## 2020-08-14 NOTE — Patient Instructions (Addendum)
If you are age 49 or older, your body mass index should be between 23-30. Your Body mass index is 30.79 kg/m. If this is out of the aforementioned range listed, please consider follow up with your Primary Care Provider.  If you are age 64 or younger, your body mass index should be between 19-25. Your Body mass index is 30.79 kg/m. If this is out of the aformentioned range listed, please consider follow up with your Primary Care Provider.   You have been scheduled for a colonoscopy. Please follow written instructions given to you at your visit today.  Please pick up your prep supplies at the pharmacy within the next 1-3 days. If you use inhalers (even only as needed), please bring them with you on the day of your procedure.  Your provider has requested that you go to the basement level for lab work before leaving today. Press "B" on the elevator. The lab is located at the first door on the left as you exit the elevator.  It was a pleasure to see you today!  Dr. Myrtie Neither

## 2020-08-15 LAB — GASTROINTESTINAL PATHOGEN PANEL PCR
C. difficile Tox A/B, PCR: UNDETERMINED — AB
Campylobacter, PCR: UNDETERMINED — AB
Cryptosporidium, PCR: UNDETERMINED — AB
E coli (ETEC) LT/ST PCR: UNDETERMINED — AB
E coli (STEC) stx1/stx2, PCR: UNDETERMINED — AB
E coli 0157, PCR: UNDETERMINED — AB
Giardia lamblia, PCR: UNDETERMINED — AB
Norovirus, PCR: UNDETERMINED — AB
Rotavirus A, PCR: UNDETERMINED — AB
Salmonella, PCR: UNDETERMINED — AB
Shigella, PCR: DETECTED — AB

## 2020-08-15 LAB — OVA AND PARASITE EXAMINATION
CONCENTRATE RESULT:: NONE SEEN
MICRO NUMBER:: 10942865
SPECIMEN QUALITY:: ADEQUATE
TRICHROME RESULT:: NONE SEEN

## 2020-08-23 LAB — OVA AND PARASITE EXAMINATION
CONCENTRATE RESULT:: NONE SEEN
MICRO NUMBER:: 10970943
SPECIMEN QUALITY:: ADEQUATE
TRICHROME RESULT:: NONE SEEN

## 2020-08-23 LAB — CLOSTRIDIUM DIFFICILE TOXIN B, QUALITATIVE, REAL-TIME PCR: Toxigenic C. Difficile by PCR: NOT DETECTED

## 2020-08-29 ENCOUNTER — Ambulatory Visit: Payer: BC Managed Care – PPO | Admitting: Infectious Diseases

## 2020-08-29 ENCOUNTER — Other Ambulatory Visit: Payer: Self-pay

## 2020-08-29 ENCOUNTER — Encounter: Payer: Self-pay | Admitting: Infectious Diseases

## 2020-08-29 DIAGNOSIS — B20 Human immunodeficiency virus [HIV] disease: Secondary | ICD-10-CM | POA: Diagnosis not present

## 2020-08-29 DIAGNOSIS — F17200 Nicotine dependence, unspecified, uncomplicated: Secondary | ICD-10-CM | POA: Diagnosis not present

## 2020-08-29 NOTE — Assessment & Plan Note (Signed)
Encouraged to quit. 

## 2020-08-29 NOTE — Progress Notes (Signed)
   Subjective:    Patient ID: Elijah Guerra, male    DOB: 1971/04/04, 49 y.o.   MRN: [id number]  HPI 49yo M with hx of HIV+, on atripla (first visit 03-2001?).He was changedatripla -->genvoya 09-2016. No problems with ART.   He was prev seen with GI issues. He has O & P x 2, stool studies showed shigella but indeterminate for all others (question validity of the test). Marland Kitchen He is scheduled for colonoscopy later this month.   He c/o joint problems. His friends tell him it is due to his medications. He was seen by urgent care and had plain films done (-).   HIV 1 RNA Quant  Date Value  08/03/2020 <20 Copies/mL  01/10/2020 21 copies/mL (H)  11/03/2018 <20 NOT DETECTED copies/mL   CD4 T Cell Abs (/uL)  Date Value  08/03/2020 556  01/10/2020 598  07/14/2018 800    Review of Systems  Constitutional: Negative for appetite change and unexpected weight change.  Respiratory: Negative for cough and shortness of breath.   Gastrointestinal: Positive for abdominal pain. Negative for constipation and diarrhea.  Genitourinary: Negative for difficulty urinating.  Musculoskeletal: Positive for arthralgias. Negative for joint swelling and myalgias.  All other systems reviewed and are negative.      Objective:   Physical Exam Vitals reviewed.  Constitutional:      General: He is not in acute distress.    Appearance: Normal appearance. He is not ill-appearing.  HENT:     Mouth/Throat:     Mouth: Mucous membranes are moist.     Pharynx: No oropharyngeal exudate.  Eyes:     Extraocular Movements: Extraocular movements intact.     Pupils: Pupils are equal, round, and reactive to light.  Cardiovascular:     Rate and Rhythm: Normal rate and regular rhythm.  Pulmonary:     Effort: Pulmonary effort is normal.     Breath sounds: Normal breath sounds.  Abdominal:     General: Bowel sounds are normal. There is no distension.     Palpations: Abdomen is soft.     Tenderness: There is no abdominal  tenderness.  Musculoskeletal:        General: No swelling or tenderness.     Cervical back: Normal range of motion and neck supple.     Right lower leg: No edema.     Left lower leg: No edema.  Skin:    General: Skin is warm and dry.  Neurological:     General: No focal deficit present.     Mental Status: He is alert.  Psychiatric:        Mood and Affect: Mood normal.           Assessment & Plan:

## 2020-08-29 NOTE — Assessment & Plan Note (Signed)
He is doing well We discussed changing him to cabaneuva or dovato to see if this improves his joint aches.  Will pharm discuss with him re: cabaneuva.  Will see him back in 4 months.  Offered/refused condoms.  Has gotten covid vax, needs booster Defers flu vax today.

## 2020-08-30 ENCOUNTER — Telehealth: Payer: Self-pay | Admitting: Pharmacist

## 2020-08-30 ENCOUNTER — Other Ambulatory Visit: Payer: Self-pay | Admitting: Pharmacist

## 2020-08-30 DIAGNOSIS — B20 Human immunodeficiency virus [HIV] disease: Secondary | ICD-10-CM

## 2020-08-30 MED ORDER — DOVATO 50-300 MG PO TABS: 1.0000 | ORAL_TABLET | Freq: Every day | ORAL | 11 refills | Status: DC

## 2020-08-30 NOTE — Telephone Encounter (Signed)
Called patient to discuss medication switch. Explained Cabenuva and the process of getting the injectable. Also explained that we are not ready to start giving the injections in the clinic just yet. Discussed switching to Dovato and how to take it. He is willing to try. Will send to his [employer]. He will finish out his Genvoya bottle and then switch to Dovato. He will call if he has any issues getting the medication or trouble taking it.

## 2020-08-30 NOTE — Progress Notes (Signed)
Switching patient to Dovato per Dr. Ninetta Lights.

## 2020-08-31 NOTE — Telephone Encounter (Signed)
Thanks so much. 

## 2020-09-04 ENCOUNTER — Ambulatory Visit: Payer: BC Managed Care – PPO | Admitting: Gastroenterology

## 2020-09-04 ENCOUNTER — Telehealth: Payer: Self-pay | Admitting: *Deleted

## 2020-09-04 NOTE — Telephone Encounter (Signed)
Patient called to review his labs after viewing them on Mychart. RPR is a false positive with a negative Treponemal. He is no longer having diarrhea, but feels something is going on with his stomach/intestines. "My stool isn't like it is supposed to be" but is not diarrhea. He has a colonoscopy 11/5 with Dr Myrtie Neither.  Please advise about the shigella result. Andree Coss, RN

## 2020-09-05 ENCOUNTER — Other Ambulatory Visit: Payer: Self-pay | Admitting: Infectious Diseases

## 2020-09-05 DIAGNOSIS — K5900 Constipation, unspecified: Secondary | ICD-10-CM

## 2020-09-05 NOTE — Telephone Encounter (Signed)
Thanks

## 2020-09-05 NOTE — Telephone Encounter (Signed)
If he had shigella (chronic at his point, which he shouldn't have), he would have frank diarrhea.  The number of indeterminant tests suggests this is an inconclusive test.  Let's repeat the stool test Let's keep the colonoscopy appt.  thanks

## 2020-09-05 NOTE — Telephone Encounter (Signed)
Relayed to patient.  He states that it is usually the first and last stool of the day that "look like pink flowers - like I'm pooping insulation. It disintegrates when I flush." He is keeping the colonoscopy appointment, would like to do the retest Dr Johnnye Sima is offering. Patient will come today to pick up stool kit, will collect first stool of the morning tomorrow. Stool kit placed at front desk. Landis Gandy, RN

## 2020-09-29 ENCOUNTER — Ambulatory Visit (AMBULATORY_SURGERY_CENTER): Payer: BC Managed Care – PPO | Admitting: Gastroenterology

## 2020-09-29 ENCOUNTER — Encounter: Payer: Self-pay | Admitting: Gastroenterology

## 2020-09-29 ENCOUNTER — Other Ambulatory Visit: Payer: Self-pay

## 2020-09-29 VITALS — BP 103/69 | HR 91 | Temp 96.9°F | Resp 19 | Ht 63.0 in | Wt 178.0 lb

## 2020-09-29 DIAGNOSIS — R1030 Lower abdominal pain, unspecified: Secondary | ICD-10-CM

## 2020-09-29 DIAGNOSIS — K5289 Other specified noninfective gastroenteritis and colitis: Secondary | ICD-10-CM | POA: Diagnosis not present

## 2020-09-29 DIAGNOSIS — K649 Unspecified hemorrhoids: Secondary | ICD-10-CM

## 2020-09-29 DIAGNOSIS — Z1211 Encounter for screening for malignant neoplasm of colon: Secondary | ICD-10-CM | POA: Diagnosis not present

## 2020-09-29 DIAGNOSIS — K625 Hemorrhage of anus and rectum: Secondary | ICD-10-CM

## 2020-09-29 DIAGNOSIS — K529 Noninfective gastroenteritis and colitis, unspecified: Secondary | ICD-10-CM

## 2020-09-29 DIAGNOSIS — R194 Change in bowel habit: Secondary | ICD-10-CM

## 2020-09-29 MED ORDER — SODIUM CHLORIDE 0.9 % IV SOLN: 500.0000 mL | Freq: Once | INTRAVENOUS | Status: DC

## 2020-09-29 NOTE — Op Note (Signed)
[facility] Patient Name: Elijah Guerra Procedure Date: 09/29/2020 1:21 PM MRN: [id number] Endoscopist: Sherilyn Cooter L. Myrtie Neither , MD Age: 49 Referring MD:  Date of Birth: 04-06-1971 Gender: Male Account #: [id number] Procedure:                Colonoscopy Indications:              Lower abdominal pain, Diarrhea, Rectal bleeding,                            Change in bowel habits                           Patient had shigella infection Dx 08/07/20, Rx with                            azithromycin. Patient has had persistent symptoms                            gradually subsiding since then. Pre-existing                            intermittent bleeding from internal hemorrhoids.                           Last colonoscopy Jan 2020 Medicines:                Monitored Anesthesia Care Procedure:                Pre-Anesthesia Assessment:                           - Prior to the procedure, a History and Physical                            was performed, and patient medications and                            allergies were reviewed. The patient's tolerance of                            previous anesthesia was also reviewed. The risks                            and benefits of the procedure and the sedation                            options and risks were discussed with the patient.                            All questions were answered, and informed consent                            was obtained. Prior Anticoagulants: The patient has  taken no previous anticoagulant or antiplatelet                            agents. ASA Grade Assessment: II - A patient with                            mild systemic disease. After reviewing the risks                            and benefits, the patient was deemed in                            satisfactory condition to undergo the procedure.                           After obtaining informed consent, the colonoscope                             was passed under direct vision. Throughout the                            procedure, the patient's blood pressure, pulse, and                            oxygen saturations were monitored continuously. The                            Colonoscope was introduced through the anus and                            advanced to the the terminal ileum, with                            identification of the appendiceal orifice and IC                            valve. The colonoscopy was performed without                            difficulty. The patient tolerated the procedure                            well. The quality of the bowel preparation was                            good. The terminal ileum, ileocecal valve,                            appendiceal orifice, and rectum were photographed. Scope In: 1:33:46 PM Scope Out: 1:49:05 PM Scope Withdrawal Time: 0 hours 13 minutes 5 seconds  Total Procedure Duration: 0 hours 15 minutes 19 seconds  Findings:                 The perianal and digital rectal examinations were  normal.                           The terminal ileum appeared normal.                           Patchy mucosal changes characterized by erythema                            and shallow ulcerated nodules were found from                            rectum to splenic flexure. The erythema was more                            marked in the sigmoid. Biopsies were taken with a                            cold forceps for histology. (2 jars)                           Internal hemorrhoids were found.                           The exam was otherwise without abnormality on                            direct and retroflexion views. Complications:            No immediate complications. Estimated Blood Loss:     Estimated blood loss was minimal. Impression:               - The examined portion of the ileum was normal.                           - Patchy mucosal changes were  found from rectum to                            splenic flexure. Biopsied.                           - Internal hemorrhoids.                           - The examination was otherwise normal on direct                            and retroflexion views.                           This does not have the typical appearance of                            Crohn's disease. ? viral (however, HIV undetected  and CD4> 500 on therapy). ? 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 The signs and symptoms of potential                            delayed complications were discussed with the                            patient. Return to normal activities tomorrow.                            Written discharge instructions were provided to the                            patient.                           - Resume previous diet.                           - Continue present medications.                           - Await pathology results.                           - No recommendation at this time regarding repeat                            colonoscopy. TBD based on clinical progress. Lajuanda Penick L. Myrtie Neither, MD 09/29/2020 2:00:46 PM This report has been signed electronically.

## 2020-09-29 NOTE — Progress Notes (Signed)
VS by CW. ?

## 2020-09-29 NOTE — Progress Notes (Signed)
A and O x3. Report to RN. Tolerated MAC anesthesia well. 

## 2020-09-29 NOTE — Patient Instructions (Signed)
YOU HAD AN ENDOSCOPIC PROCEDURE TODAY AT THE [facility]:   Refer to the procedure report that was given to you for any specific questions about what was found during the examination.  If the procedure report does not answer your questions, please call your gastroenterologist to clarify.  If you requested that your care partner not be given the details of your procedure findings, then the procedure report has been included in a sealed envelope for you to review at your convenience later.  YOU SHOULD EXPECT: Some feelings of bloating in the abdomen. Passage of more gas than usual.  Walking can help get rid of the air that was put into your GI tract during the procedure and reduce the bloating. If you had a lower endoscopy (such as a colonoscopy or flexible sigmoidoscopy) you may notice spotting of blood in your stool or on the toilet paper. If you underwent a bowel prep for your procedure, you may not have a normal bowel movement for a few days.  Please Note:  You might notice some irritation and congestion in your nose or some drainage.  This is from the oxygen used during your procedure.  There is no need for concern and it should clear up in a day or so.  SYMPTOMS TO REPORT IMMEDIATELY:   Following lower endoscopy (colonoscopy or flexible sigmoidoscopy):  Excessive amounts of blood in the stool  Significant tenderness or worsening of abdominal pains  Swelling of the abdomen that is new, acute  Fever of 100F or higher  For urgent or emergent issues, a gastroenterologist can be reached at any hour by calling [phone]. Do not use MyChart messaging for urgent concerns.    DIET:  We do recommend a small meal at first, but then you may proceed to your regular diet.  Drink plenty of fluids but you should avoid alcoholic beverages for 24 hours.  ACTIVITY:  You should plan to take it easy for the rest of today and you should NOT DRIVE or use heavy machinery until tomorrow (because  of the sedation medicines used during the test).    FOLLOW UP: Our staff will call the number listed on your records 48-72 hours following your procedure to check on you and address any questions or concerns that you may have regarding the information given to you following your procedure. If we do not reach you, we will leave a message.  We will attempt to reach you two times.  During this call, we will ask if you have developed any symptoms of COVID 19. If you develop any symptoms (ie: fever, flu-like symptoms, shortness of breath, cough etc.) before then, please call [phone].  If you test positive for Covid 19 in the 2 weeks post procedure, please call and report this information to us.    If any biopsies were taken you will be contacted by phone or by letter within the next 1-3 weeks.  Please call us at [phone] if you have not heard about the biopsies in 3 weeks.    SIGNATURES/CONFIDENTIALITY: You and/or your care partner have signed paperwork which will be entered into your electronic medical record.  These signatures attest to the fact that that the information above on your After Visit Summary has been reviewed and is understood.  Full responsibility of the confidentiality of this discharge information lies with you and/or your care-partner. 

## 2020-09-29 NOTE — Progress Notes (Signed)
Called to room to assist during endoscopic procedure.  Patient ID and intended procedure confirmed with present staff. Received instructions for my participation in the procedure from the performing physician.  

## 2020-10-03 ENCOUNTER — Telehealth: Payer: Self-pay

## 2020-10-03 NOTE — Telephone Encounter (Signed)
  Follow up Call-  Call back number 09/29/2020 12/15/2018  Post procedure Call Back phone  # [phone]  Permission to leave phone message Yes Yes  Some recent data might be hidden     Patient questions:  Do you have a fever, pain , or abdominal swelling? No. Pain Score  0 *  Have you tolerated food without any problems? Yes.    Have you been able to return to your normal activities? Yes.    Do you have any questions about your discharge instructions: Diet   No. Medications  No. Follow up visit  No.  Do you have questions or concerns about your Care? No.  Actions: * If pain score is 4 or above: No action needed, pain <4. 1. Have you developed a fever since your procedure? no  2.   Have you had an respiratory symptoms (SOB or cough) since your procedure? no  3.   Have you tested positive for COVID 19 since your procedure no  4.   Have you had any family members/close contacts diagnosed with the COVID 19 since your procedure?  no   If yes to any of these questions please route to Laverna Peace, RN and Karlton Lemon, RN

## 2020-10-10 ENCOUNTER — Encounter: Payer: Self-pay | Admitting: Gastroenterology

## 2020-10-25 ENCOUNTER — Other Ambulatory Visit: Payer: BC Managed Care – PPO

## 2020-11-14 ENCOUNTER — Encounter: Payer: BC Managed Care – PPO | Admitting: Infectious Diseases

## 2020-12-26 ENCOUNTER — Other Ambulatory Visit: Payer: Self-pay

## 2020-12-26 ENCOUNTER — Other Ambulatory Visit: Payer: PRIVATE HEALTH INSURANCE

## 2020-12-26 DIAGNOSIS — B20 Human immunodeficiency virus [HIV] disease: Secondary | ICD-10-CM

## 2020-12-27 LAB — T-HELPER CELL (CD4) - (RCID CLINIC ONLY)
CD4 % Helper T Cell: 34 % (ref 33–65)
CD4 T Cell Abs: 652 /uL (ref 400–1790)

## 2020-12-29 LAB — COMPREHENSIVE METABOLIC PANEL
AG Ratio: 1.8 (calc) (ref 1.0–2.5)
ALT: 26 U/L (ref 9–46)
AST: 21 U/L (ref 10–40)
Albumin: 4.4 g/dL (ref 3.6–5.1)
Alkaline phosphatase (APISO): 81 U/L (ref 36–130)
BUN: 14 mg/dL (ref 7–25)
CO2: 30 mmol/L (ref 20–32)
Calcium: 9.3 mg/dL (ref 8.6–10.3)
Chloride: 105 mmol/L (ref 98–110)
Creat: 1.33 mg/dL (ref 0.60–1.35)
Globulin: 2.5 g/dL (calc) (ref 1.9–3.7)
Glucose, Bld: 150 mg/dL — ABNORMAL HIGH (ref 65–99)
Potassium: 3.8 mmol/L (ref 3.5–5.3)
Sodium: 141 mmol/L (ref 135–146)
Total Bilirubin: 0.9 mg/dL (ref 0.2–1.2)
Total Protein: 6.9 g/dL (ref 6.1–8.1)

## 2020-12-29 LAB — CBC
HCT: 44.1 % (ref 38.5–50.0)
Hemoglobin: 14.9 g/dL (ref 13.2–17.1)
MCH: 33.6 pg — ABNORMAL HIGH (ref 27.0–33.0)
MCHC: 33.8 g/dL (ref 32.0–36.0)
MCV: 99.3 fL (ref 80.0–100.0)
MPV: 10.9 fL (ref 7.5–12.5)
Platelets: 224 10*3/uL (ref 140–400)
RBC: 4.44 10*6/uL (ref 4.20–5.80)
RDW: 11.2 % (ref 11.0–15.0)
WBC: 4.9 10*3/uL (ref 3.8–10.8)

## 2020-12-29 LAB — HIV-1 RNA QUANT-NO REFLEX-BLD
HIV 1 RNA Quant: <20 Copies/mL
HIV-1 RNA Quant, Log: <1.3 Log cps/mL

## 2021-01-09 ENCOUNTER — Other Ambulatory Visit: Payer: Self-pay

## 2021-01-09 ENCOUNTER — Ambulatory Visit (INDEPENDENT_AMBULATORY_CARE_PROVIDER_SITE_OTHER): Payer: PRIVATE HEALTH INSURANCE | Admitting: Infectious Diseases

## 2021-01-09 VITALS — BP 129/90 | HR 88 | Temp 98.4°F | Ht 66.0 in | Wt 188.0 lb

## 2021-01-09 DIAGNOSIS — R351 Nocturia: Secondary | ICD-10-CM

## 2021-01-09 DIAGNOSIS — B20 Human immunodeficiency virus [HIV] disease: Secondary | ICD-10-CM

## 2021-01-09 DIAGNOSIS — K5900 Constipation, unspecified: Secondary | ICD-10-CM

## 2021-01-09 DIAGNOSIS — Z113 Encounter for screening for infections with a predominantly sexual mode of transmission: Secondary | ICD-10-CM

## 2021-01-09 DIAGNOSIS — R739 Hyperglycemia, unspecified: Secondary | ICD-10-CM

## 2021-01-09 DIAGNOSIS — Z79899 Other long term (current) drug therapy: Secondary | ICD-10-CM

## 2021-01-09 DIAGNOSIS — F17200 Nicotine dependence, unspecified, uncomplicated: Secondary | ICD-10-CM

## 2021-01-09 MED ORDER — TRULANCE 3 MG PO TABS: 3.0000 mg | ORAL_TABLET | Freq: Every day | ORAL | 0 refills | Status: DC

## 2021-01-09 NOTE — Progress Notes (Signed)
   Subjective:    Patient ID: Elijah Guerra, male  DOB: February 16, 1971, 50 y.o.        MRN: [id number]   HPI 50yo M with hx of HIV+, on atripla(first visit 03-2001?).He was changedatripla -->genvoya 09-2016. No problems with ART.He was switched to dovato 08-2020.  Has had no problems with new ART. occas forgets.   HIV 1 RNA Quant  Date Value  12/26/2020 <20 Copies/mL  08/03/2020 <20 Copies/mL  01/10/2020 21 copies/mL (H)   CD4 T Cell Abs (/uL)  Date Value  12/26/2020 652  08/03/2020 556  01/10/2020 598     Health Maintenance  Topic Date Due  . INFLUENZA VACCINE  06/25/2020  . COVID-19 Vaccine (4 - Booster for Pfizer series) 06/06/2021  . TETANUS/TDAP  09/30/2028  . COLONOSCOPY (Pts 45-80yrs Insurance coverage will need to be confirmed)  09/29/2030  . Hepatitis C Screening  Completed  . HIV Screening  Completed    Asks for refill of tulant, constipation.   Review of Systems  Constitutional: Negative for chills, fever and weight loss.  Respiratory: Negative for cough and shortness of breath.   Gastrointestinal: Positive for constipation. Negative for diarrhea.  Genitourinary: Positive for urgency (nocturia). Negative for dysuria.    Please see HPI. All other systems reviewed and negative.     Objective:  Physical Exam Constitutional:      General: He is not in acute distress.    Appearance: Normal appearance. He is not ill-appearing or toxic-appearing.  HENT:     Mouth/Throat:     Mouth: Mucous membranes are moist.     Pharynx: No oropharyngeal exudate.  Eyes:     Extraocular Movements: Extraocular movements intact.     Pupils: Pupils are equal, round, and reactive to light.  Cardiovascular:     Rate and Rhythm: Normal rate and regular rhythm.  Pulmonary:     Effort: Pulmonary effort is normal. No respiratory distress.     Breath sounds: Normal breath sounds.  Abdominal:     General: Bowel sounds are normal. There is no distension.     Palpations: Abdomen  is soft.     Tenderness: There is no abdominal tenderness.  Musculoskeletal:        General: Normal range of motion.     Cervical back: Normal range of motion. No rigidity.     Right lower leg: No edema.     Left lower leg: No edema.  Lymphadenopathy:     Cervical: No cervical adenopathy.  Neurological:     General: No focal deficit present.     Mental Status: He is alert.  Psychiatric:        Mood and Affect: Mood normal.            Assessment & Plan:

## 2021-01-09 NOTE — Assessment & Plan Note (Signed)
Has cut back 

## 2021-01-09 NOTE — Assessment & Plan Note (Addendum)
Had urine Glc check yesterday at [facility] physical- normal Will check his A1C

## 2021-01-09 NOTE — Assessment & Plan Note (Signed)
Have advised him to try non-pharm methods- No fluids after 8pm No caffiene or etoh after dinner He is worried his prostate has become enlarged.  Has some daytime dribbling.  uro eval.  PSA 0.9 2019

## 2021-01-09 NOTE — Assessment & Plan Note (Signed)
Doing well Refer for cabaneuva when Cassie returns.  Offered/refused condoms.  Had colon 09-2020 rtc in 9 monhts, labs prior

## 2021-01-09 NOTE — Assessment & Plan Note (Signed)
Refill his trulance

## 2021-01-10 LAB — HEMOGLOBIN A1C
Hgb A1c MFr Bld: 4.8 % of total Hgb (ref <5.7)
Mean Plasma Glucose: 91 mg/dL
eAG (mmol/L): 5 mmol/L

## 2021-01-10 NOTE — Addendum Note (Signed)
Addended by: Jamesia Linnen C on: 01/10/2021 12:24 PM   Modules accepted: Orders

## 2021-06-08 ENCOUNTER — Telehealth: Payer: Self-pay

## 2021-06-08 ENCOUNTER — Other Ambulatory Visit (HOSPITAL_COMMUNITY): Payer: Self-pay

## 2021-06-08 NOTE — Telephone Encounter (Signed)
I'll look into this for him.

## 2021-06-08 NOTE — Telephone Encounter (Signed)
[facility] Patient Advocate Encounter  Elijah Guerra is covered under patient medical benefits.  No PA is required , however a Predetermination is required.  I faxed the J-code, spelling of drug , milligram and the cost of the drug & chart notes to [phone] Attn: Adair Patter.   It will take up to 5-7 days to get an approval.  Phone# [phone]  Clearance Coots, CPhT Specialty Pharmacy Patient [facility] for Infectious Disease Phone: [phone] Fax:  [phone]

## 2021-07-04 ENCOUNTER — Telehealth: Payer: Self-pay

## 2021-07-04 ENCOUNTER — Other Ambulatory Visit (HOSPITAL_COMMUNITY): Payer: Self-pay

## 2021-07-04 NOTE — Telephone Encounter (Signed)
[facility] Patient Advocate Encounter  Prior Authorization for Elijah Guerra has been approved.     Effective dates: 06/08/21 through 06/14/22  Medication is covered under medical benefits.   Patient will have a $65.00 office visit copay.   Patient have a $4000.00 deductible which $0.00 have been met.   Patient is enrolled in the [facility] Portal Claims.  [facility] will continue to follow.  Ileene Patrick, [place] [facility] Patient [facility] for Infectious Disease Phone: [phone] Fax:  [phone]

## 2021-08-15 ENCOUNTER — Encounter: Payer: Self-pay | Admitting: Pharmacist

## 2021-08-28 ENCOUNTER — Other Ambulatory Visit: Payer: Self-pay

## 2021-08-28 DIAGNOSIS — K5900 Constipation, unspecified: Secondary | ICD-10-CM

## 2021-08-29 NOTE — Telephone Encounter (Signed)
Please advise if okay to refill. 

## 2021-08-30 ENCOUNTER — Other Ambulatory Visit: Payer: Self-pay | Admitting: Infectious Diseases

## 2021-08-30 MED ORDER — TRULANCE 3 MG PO TABS: 3.0000 mg | ORAL_TABLET | Freq: Every day | ORAL | 0 refills | Status: DC

## 2021-10-01 ENCOUNTER — Other Ambulatory Visit: Payer: Self-pay | Admitting: Pharmacist

## 2021-10-01 DIAGNOSIS — B20 Human immunodeficiency virus [HIV] disease: Secondary | ICD-10-CM

## 2021-10-02 ENCOUNTER — Telehealth: Payer: Self-pay

## 2021-10-02 NOTE — Telephone Encounter (Signed)
Lupita Leash - do you mind calling Alexanderjames and discussing that Renaldo Harrison was approved on his insurance? It looks like he has some questions. Thank you!

## 2021-10-02 NOTE — Telephone Encounter (Signed)
[facility] Patient Advocate Encounter  I have been unsuccsessful in reaching patient to be able to give him information on his  medication.  Elijah Guerra)    Clearance Coots, CPhT Specialty Pharmacy Patient [facility] for Infectious Disease Phone: [phone] Fax:  [phone]

## 2021-10-02 NOTE — Telephone Encounter (Signed)
Elijah Guerra was able to reach patient. He is coming in tomorrow for first injection.

## 2021-10-03 ENCOUNTER — Ambulatory Visit (INDEPENDENT_AMBULATORY_CARE_PROVIDER_SITE_OTHER): Payer: PRIVATE HEALTH INSURANCE | Admitting: Pharmacist

## 2021-10-03 ENCOUNTER — Other Ambulatory Visit: Payer: Self-pay

## 2021-10-03 ENCOUNTER — Other Ambulatory Visit (HOSPITAL_COMMUNITY)
Admission: RE | Admit: 2021-10-03 | Discharge: 2021-10-03 | Disposition: A | Discharge status: Home / Self Care | Payer: PRIVATE HEALTH INSURANCE | Source: Ambulatory Visit | Attending: Infectious Diseases | Admitting: Infectious Diseases

## 2021-10-03 DIAGNOSIS — B20 Human immunodeficiency virus [HIV] disease: Secondary | ICD-10-CM | POA: Insufficient documentation

## 2021-10-03 MED ORDER — CABOTEGRAVIR & RILPIVIRINE ER 600 & 900 MG/3ML IM SUER
1.0000 | Freq: Once | INTRAMUSCULAR | Status: AC
  Administered 2021-10-03: 1 via INTRAMUSCULAR

## 2021-10-03 NOTE — Progress Notes (Signed)
HPI: Elijah Guerra is a 50 y.o. male who presents to the [facility] pharmacy clinic for [place] administration.  Patient Active Problem List   Diagnosis Date Noted   Nocturia more than twice per night 01/09/2021   Healthcare maintenance 11/06/2018   Hepatitis B immune 01/22/2017   Skin nodule 09/04/2015   Contact dermatitis 09/15/2014   Hyperglycemia 08/30/2013   Erectile dysfunction 08/30/2013   Arthritis 02/12/2012   GERD 12/29/2009   Constipation 12/29/2009   VOMITING 12/29/2009   PROTEINURIA, MILD 04/01/2007   Human immunodeficiency virus (HIV) disease (HCC) 01/01/2007   DISORDER, TOBACCO USE 01/01/2007    Patient's Medications  New Prescriptions   No medications on file  Previous Medications   DOLUTEGRAVIR-LAMIVUDINE (DOVATO) 50-300 MG TABS    Take 1 tablet by mouth daily.   FLUOCINONIDE CREAM (LIDEX) 0.05 %    APPLY EXTERNALLY TO THE AFFECTED AREA TWICE DAILY THEN AS NEEDED FLARE / ITCHING   KETOCONAZOLE (NIZORAL) 2 % CREAM    Apply 1 application topically as needed.   PLECANATIDE (TRULANCE) 3 MG TABS    Take 3 mg by mouth daily.  Modified Medications   No medications on file  Discontinued Medications   No medications on file    Allergies: No Known Allergies  Past Medical History: Past Medical History:  Diagnosis Date   GERD (gastroesophageal reflux disease)    HIV (human immunodeficiency virus infection) (HCC)     Social History: Social History   Socioeconomic History   Marital status: Single    Spouse name: Not on file   Number of children: Not on file   Years of education: Not on file   Highest education level: Not on file  Occupational History   Not on file  Tobacco Use   Smoking status: Some Days    Packs/day: 0.30    Types: Cigars, Cigarettes   Smokeless tobacco: Never  Vaping Use   Vaping Use: Never used  Substance and Sexual Activity   Alcohol use: No    Alcohol/week: 0.0 standard drinks   Drug use: No   Sexual activity: Yes    Partners:  Male    Birth control/protection: Condom    Comment: refused condoms  Other Topics Concern   Not on file  Social History Narrative   Not on file   Social Determinants of Health   Financial Resource Strain: Not on file  Food Insecurity: Not on file  Transportation Needs: Not on file  Physical Activity: Not on file  Stress: Not on file  Social Connections: Not on file    Labs: Lab Results  Component Value Date   HIV1RNAQUANT <20 12/26/2020   HIV1RNAQUANT <20 08/03/2020   HIV1RNAQUANT 21 (H) 01/10/2020   CD4TABS 652 12/26/2020   CD4TABS 556 08/03/2020   CD4TABS 598 01/10/2020    RPR and STI Lab Results  Component Value Date   LABRPR REACTIVE (A) 08/03/2020   LABRPR NON-REACTIVE 07/14/2018   LABRPR NON-REACTIVE 09/08/2017   LABRPR NON REAC 01/22/2017   LABRPR NON REAC 10/02/2016   RPRTITER 1:1 (H) 08/03/2020    STI Results GC CT  08/03/2020 Negative Negative  07/14/2018 Negative Negative  09/08/2017 Negative Negative  01/22/2017 Negative Negative  03/01/2015 Negative Negative    Hepatitis B Lab Results  Component Value Date   HEPBSAB POS (A) 09/04/2015   HEPBSAG NEGATIVE 09/04/2015   Hepatitis C No results found for: HEPCAB, HCVRNAPCRQN Hepatitis A Lab Results  Component Value Date   HAV NEG 12/29/2007  Lipids: Lab Results  Component Value Date   CHOL 173 08/03/2020   TRIG 173 (H) 08/03/2020   HDL 42 08/03/2020   CHOLHDL 4.1 08/03/2020   VLDL 30 01/22/2017   LDLCALC 102 (H) 08/03/2020    Current HIV Regimen: Dovato  TARGET DATE: The 9th of the month  Assessment: Elijah Guerra presents today for their first initiation injection for Cabenuva. Counseled that [place] is two separate intramuscular injections in the gluteal muscle on each side for each visit. Explained that the second injection is 30 days after the initial injection then every 2 months thereafter. Discussed the need for viral load monitoring every 2 months for the first 6 months and then  periodically afterwards as their provider sees the need. Discussed the rare but significant chance of developing resistance despite compliance. Explained that showing up to injection appointments is very important and warned that if 2 appointments are missed, it will be reassessed by their provider whether they are a good candidate for injection therapy. Counseled on possible side effects associated with the injections such as injection site pain, which is usually mild to moderate in nature, injection site nodules, and injection site reactions. Asked to call the clinic or send me a mychart message if they experience any issues, such as fatigue, nausea, headache, rash, or dizziness. Advised that they can take ibuprofen or tylenol for injection site pain if needed.   Administered cabotegravir 600mg /50mL in left upper outer quadrant of the gluteal muscle. Administered rilpivirine 900 mg/44mL in the right upper outer quadrant of the gluteal muscle. Monitored patient for 10 minutes after injection. Injections were tolerated well without issue. Counseled to stop taking Dovato after today's dose and to call with any issues that may arise. He has been out of Dovato for 3 days now.Will make follow up appointments for second initiation injection in 30 days and then maintenance injections every 2 months thereafter for 6 months.   Plan: - Stop Dovato - First Cabenuva injections administered - Second initiation injection scheduled for 11/02/21@ 2:30 - Maintenance injections scheduled for 01/03/22 @ 2:30 - Call with any issues or questions -Check HIV RNA, CD4, CBC, CMET, Urine and Oral Cytology  Lestine Box, PharmD PGY2 Infectious Diseases Pharmacy Resident

## 2021-10-04 LAB — CYTOLOGY, (ORAL, ANAL, URETHRAL) ANCILLARY ONLY
Chlamydia: NEGATIVE
Comment: NEGATIVE
Comment: NORMAL
Neisseria Gonorrhea: NEGATIVE

## 2021-10-04 LAB — URINE CYTOLOGY ANCILLARY ONLY
Chlamydia: NEGATIVE
Comment: NEGATIVE
Comment: NORMAL
Neisseria Gonorrhea: NEGATIVE

## 2021-10-04 LAB — T-HELPER CELLS (CD4) COUNT (NOT AT ARMC)
CD4 % Helper T Cell: 28 % — ABNORMAL LOW (ref 33–65)
CD4 T Cell Abs: 391 /uL — ABNORMAL LOW (ref 400–1790)

## 2021-10-06 LAB — CBC
HCT: 46.7 % (ref 38.5–50.0)
Hemoglobin: 15.7 g/dL (ref 13.2–17.1)
MCH: 33.8 pg — ABNORMAL HIGH (ref 27.0–33.0)
MCHC: 33.6 g/dL (ref 32.0–36.0)
MCV: 100.6 fL — ABNORMAL HIGH (ref 80.0–100.0)
MPV: 11.2 fL (ref 7.5–12.5)
Platelets: 190 10*3/uL (ref 140–400)
RBC: 4.64 10*6/uL (ref 4.20–5.80)
RDW: 11.4 % (ref 11.0–15.0)
WBC: 5.5 10*3/uL (ref 3.8–10.8)

## 2021-10-06 LAB — COMPREHENSIVE METABOLIC PANEL
AG Ratio: 1.6 (calc) (ref 1.0–2.5)
ALT: 22 U/L (ref 9–46)
AST: 20 U/L (ref 10–35)
Albumin: 4.6 g/dL (ref 3.6–5.1)
Alkaline phosphatase (APISO): 84 U/L (ref 35–144)
BUN: 15 mg/dL (ref 7–25)
CO2: 26 mmol/L (ref 20–32)
Calcium: 9.7 mg/dL (ref 8.6–10.3)
Chloride: 108 mmol/L (ref 98–110)
Creat: 1.19 mg/dL (ref 0.70–1.30)
Globulin: 2.9 g/dL (calc) (ref 1.9–3.7)
Glucose, Bld: 71 mg/dL (ref 65–99)
Potassium: 3.9 mmol/L (ref 3.5–5.3)
Sodium: 146 mmol/L (ref 135–146)
Total Bilirubin: 0.9 mg/dL (ref 0.2–1.2)
Total Protein: 7.5 g/dL (ref 6.1–8.1)

## 2021-10-06 LAB — HIV-1 RNA QUANT-NO REFLEX-BLD
HIV 1 RNA Quant: NOT DETECTED Copies/mL
HIV-1 RNA Quant, Log: NOT DETECTED Log cps/mL

## 2021-11-02 ENCOUNTER — Other Ambulatory Visit: Payer: Self-pay

## 2021-11-02 ENCOUNTER — Ambulatory Visit (INDEPENDENT_AMBULATORY_CARE_PROVIDER_SITE_OTHER): Payer: PRIVATE HEALTH INSURANCE | Admitting: Pharmacist

## 2021-11-02 DIAGNOSIS — B20 Human immunodeficiency virus [HIV] disease: Secondary | ICD-10-CM | POA: Diagnosis not present

## 2021-11-02 MED ORDER — CABOTEGRAVIR & RILPIVIRINE ER 600 & 900 MG/3ML IM SUER
1.0000 | Freq: Once | INTRAMUSCULAR | Status: AC
  Administered 2021-11-02: 1 via INTRAMUSCULAR

## 2021-11-02 NOTE — Progress Notes (Signed)
HPI: Elijah Guerra is a 49 y.o. male who presents to the [facility] pharmacy clinic for [place] administration.  Patient Active Problem List   Diagnosis Date Noted   Nocturia more than twice per night 01/09/2021   Healthcare maintenance 11/06/2018   Hepatitis B immune 01/22/2017   Skin nodule 09/04/2015   Contact dermatitis 09/15/2014   Hyperglycemia 08/30/2013   Erectile dysfunction 08/30/2013   Arthritis 02/12/2012   GERD 12/29/2009   Constipation 12/29/2009   VOMITING 12/29/2009   PROTEINURIA, MILD 04/01/2007   Human immunodeficiency virus (HIV) disease (HCC) 01/01/2007   DISORDER, TOBACCO USE 01/01/2007    Patient's Medications  New Prescriptions   No medications on file  Previous Medications   DOLUTEGRAVIR-LAMIVUDINE (DOVATO) 50-300 MG TABS    Take 1 tablet by mouth daily.   FLUOCINONIDE CREAM (LIDEX) 0.05 %    APPLY EXTERNALLY TO THE AFFECTED AREA TWICE DAILY THEN AS NEEDED FLARE / ITCHING   KETOCONAZOLE (NIZORAL) 2 % CREAM    Apply 1 application topically as needed.   PLECANATIDE (TRULANCE) 3 MG TABS    Take 3 mg by mouth daily.  Modified Medications   No medications on file  Discontinued Medications   No medications on file    Allergies: No Known Allergies  Past Medical History: Past Medical History:  Diagnosis Date   GERD (gastroesophageal reflux disease)    HIV (human immunodeficiency virus infection) (HCC)     Social History: Social History   Socioeconomic History   Marital status: Single    Spouse name: Not on file   Number of children: Not on file   Years of education: Not on file   Highest education level: Not on file  Occupational History   Not on file  Tobacco Use   Smoking status: Some Days    Packs/day: 0.30    Types: Cigars, Cigarettes   Smokeless tobacco: Never  Vaping Use   Vaping Use: Never used  Substance and Sexual Activity   Alcohol use: No    Alcohol/week: 0.0 standard drinks   Drug use: No   Sexual activity: Yes    Partners:  Male    Birth control/protection: Condom    Comment: refused condoms  Other Topics Concern   Not on file  Social History Narrative   Not on file   Social Determinants of Health   Financial Resource Strain: Not on file  Food Insecurity: Not on file  Transportation Needs: Not on file  Physical Activity: Not on file  Stress: Not on file  Social Connections: Not on file    Labs: Lab Results  Component Value Date   HIV1RNAQUANT Not Detected 10/03/2021   HIV1RNAQUANT <20 12/26/2020   HIV1RNAQUANT <20 08/03/2020   CD4TABS 391 (L) 10/03/2021   CD4TABS 652 12/26/2020   CD4TABS 556 08/03/2020    RPR and STI Lab Results  Component Value Date   LABRPR REACTIVE (A) 08/03/2020   LABRPR NON-REACTIVE 07/14/2018   LABRPR NON-REACTIVE 09/08/2017   LABRPR NON REAC 01/22/2017   LABRPR NON REAC 10/02/2016   RPRTITER 1:1 (H) 08/03/2020    STI Results GC CT  10/03/2021 Negative Negative  10/03/2021 Negative Negative  08/03/2020 Negative Negative  07/14/2018 Negative Negative  09/08/2017 Negative Negative  01/22/2017 Negative Negative  03/01/2015 Negative Negative    Hepatitis B Lab Results  Component Value Date   HEPBSAB POS (A) 09/04/2015   HEPBSAG NEGATIVE 09/04/2015   Hepatitis C No results found for: HEPCAB, HCVRNAPCRQN Hepatitis A Lab Results  Component Value Date   HAV NEG 12/29/2007   Lipids: Lab Results  Component Value Date   CHOL 173 08/03/2020   TRIG 173 (H) 08/03/2020   HDL 42 08/03/2020   CHOLHDL 4.1 08/03/2020   VLDL 30 01/22/2017   LDLCALC 102 (H) 08/03/2020    TARGET DATE:  The 9th of the month  Current HIV Regimen: Cabenuva  Assessment: Elijah Guerra presents today for their maintenance Cabenuva injections. Initial/past injections were tolerated well without issues. No problems with systemic effects of injections. Patient did experience mild soreness for 4 days after the first injection. Will check HIV RNA today.   Administered cabotegravir 600mg /83mL in  left upper outer quadrant of the gluteal muscle. Administered rilpivirine 900 mg/50mL in the right upper outer quadrant of the gluteal muscle. Monitored patient for 10 minutes after injection. Injections were tolerated well without issue. Patient will follow up in 2 months for next injection.  Plan: - Cabenuva injections administered - Next injections scheduled for 2/8 with Cassie - Check HIV RNA today - Call with any issues or questions  Alfonse Spruce, PharmD, CPP Clinical Pharmacist Practitioner [place] for Infectious Disease

## 2021-11-04 LAB — HIV-1 RNA QUANT-NO REFLEX-BLD
HIV 1 RNA Quant: NOT DETECTED Copies/mL
HIV-1 RNA Quant, Log: NOT DETECTED Log cps/mL

## 2021-11-28 ENCOUNTER — Encounter: Payer: Self-pay | Admitting: Pharmacist

## 2022-01-03 ENCOUNTER — Ambulatory Visit (INDEPENDENT_AMBULATORY_CARE_PROVIDER_SITE_OTHER): Payer: PRIVATE HEALTH INSURANCE | Admitting: Pharmacist

## 2022-01-03 ENCOUNTER — Other Ambulatory Visit: Payer: Self-pay

## 2022-01-03 ENCOUNTER — Ambulatory Visit: Payer: PRIVATE HEALTH INSURANCE | Admitting: Pharmacist

## 2022-01-03 DIAGNOSIS — K5900 Constipation, unspecified: Secondary | ICD-10-CM

## 2022-01-03 DIAGNOSIS — Z79899 Other long term (current) drug therapy: Secondary | ICD-10-CM | POA: Diagnosis not present

## 2022-01-03 DIAGNOSIS — B20 Human immunodeficiency virus [HIV] disease: Secondary | ICD-10-CM | POA: Diagnosis not present

## 2022-01-03 DIAGNOSIS — Z113 Encounter for screening for infections with a predominantly sexual mode of transmission: Secondary | ICD-10-CM

## 2022-01-03 MED ORDER — TRULANCE 3 MG PO TABS: 3.0000 mg | ORAL_TABLET | Freq: Every day | ORAL | 0 refills | Status: DC

## 2022-01-03 MED ORDER — CABOTEGRAVIR & RILPIVIRINE ER 600 & 900 MG/3ML IM SUER
1.0000 | Freq: Once | INTRAMUSCULAR | Status: AC
  Administered 2022-01-03: 1 via INTRAMUSCULAR

## 2022-01-03 NOTE — Progress Notes (Signed)
HPI: Elijah Guerra is a 51 y.o. male who presents to the [facility] clinic for [place] administration.  Patient Active Problem List   Diagnosis Date Noted   Nocturia more than twice per night 01/09/2021   Healthcare maintenance 11/06/2018   Hepatitis B immune 01/22/2017   Skin nodule 09/04/2015   Contact dermatitis 09/15/2014   Hyperglycemia 08/30/2013   Erectile dysfunction 08/30/2013   Arthritis 02/12/2012   GERD 12/29/2009   Constipation 12/29/2009   VOMITING 12/29/2009   PROTEINURIA, MILD 04/01/2007   Human immunodeficiency virus (HIV) disease ([place]) 01/01/2007   DISORDER, TOBACCO USE 01/01/2007    Patient's Medications  New Prescriptions   No medications on file  Previous Medications   DOLUTEGRAVIR-LAMIVUDINE (DOVATO) 50-300 MG TABS    Take 1 tablet by mouth daily.   FLUOCINONIDE CREAM (LIDEX) 0.05 %    APPLY EXTERNALLY TO THE AFFECTED AREA TWICE DAILY THEN AS NEEDED FLARE / ITCHING   KETOCONAZOLE (NIZORAL) 2 % CREAM    Apply 1 application topically as needed.   PLECANATIDE (TRULANCE) 3 MG TABS    Take 3 mg by mouth daily.  Modified Medications   No medications on file  Discontinued Medications   No medications on file    Allergies: No Known Allergies  Past Medical History: Past Medical History:  Diagnosis Date   GERD (gastroesophageal reflux disease)    HIV (human immunodeficiency virus infection) ([place])     Social History: Social History   Socioeconomic History   Marital status: Single    Spouse name: Not on file   Number of children: Not on file   Years of education: Not on file   Highest education level: Not on file  Occupational History   Not on file  Tobacco Use   Smoking status: Some Days    Packs/day: 0.30    Types: Cigars, Cigarettes   Smokeless tobacco: Never  Vaping Use   Vaping Use: Never used  Substance and Sexual Activity   Alcohol use: No    Alcohol/week: 0.0 standard drinks   Drug use: No   Sexual activity: Yes    Partners:  Male    Birth control/protection: Condom    Comment: refused condoms  Other Topics Concern   Not on file  Social History Narrative   Not on file   Social Determinants of Health   Financial Resource Strain: Not on file  Food Insecurity: Not on file  Transportation Needs: Not on file  Physical Activity: Not on file  Stress: Not on file  Social Connections: Not on file    Labs: Lab Results  Component Value Date   HIV1RNAQUANT Not Detected 11/02/2021   HIV1RNAQUANT Not Detected 10/03/2021   HIV1RNAQUANT <20 12/26/2020   CD4TABS 391 (L) 10/03/2021   CD4TABS 652 12/26/2020   CD4TABS 556 08/03/2020    RPR and STI Lab Results  Component Value Date   LABRPR REACTIVE (A) 08/03/2020   LABRPR NON-REACTIVE 07/14/2018   LABRPR NON-REACTIVE 09/08/2017   LABRPR NON REAC 01/22/2017   LABRPR NON REAC 10/02/2016   RPRTITER 1:1 (H) 08/03/2020    STI Results GC CT  10/03/2021 Negative Negative  10/03/2021 Negative Negative  08/03/2020 Negative Negative  07/14/2018 Negative Negative  09/08/2017 Negative Negative  01/22/2017 Negative Negative  03/01/2015 Negative Negative    Hepatitis B Lab Results  Component Value Date   HEPBSAB POS (A) 09/04/2015   HEPBSAG NEGATIVE 09/04/2015   Hepatitis C No results found for: HEPCAB, HCVRNAPCRQN Hepatitis A Lab Results  Component Value Date   HAV NEG 12/29/2007   Lipids: Lab Results  Component Value Date   CHOL 173 08/03/2020   TRIG 173 (H) 08/03/2020   HDL 42 08/03/2020   CHOLHDL 4.1 08/03/2020   VLDL 30 01/22/2017   LDLCALC 102 (H) 08/03/2020    TARGET DATE: The 9th of the month  Assessment: Feltom presents today for their maintenance Cabenuva injections. Past injections were tolerated well without issues. No problems with systemic side effects of injections.   Administered cabotegravir 600mg /2mL in left upper outer quadrant of the gluteal muscle. Administered rilpivirine 900 mg/24mL in the right upper outer quadrant of the  gluteal muscle. Monitored patient for 10 minutes after injection. Injections were tolerated well without issue. Patient will follow up in 2 months for next set of injections.  Plan: - Cabenuva injections administered - HIV RNA, CD4, RPR, CMET, CBC, urine cytology checked today - Next injections scheduled for 4/13 with Dr. Johnnye Sima and 6/8 with me - Call with any issues or questions  Haylea Schlichting L. Quran Vasco, PharmD, BCIDP, AAHIVP, [place] Clinical Pharmacist Practitioner [facility]

## 2022-01-05 LAB — T-HELPER CELLS (CD4) COUNT (NOT AT ARMC)
Absolute CD4: 522 cells/uL (ref 490–1740)
CD4 T Helper %: 32 % (ref 30–61)
Total lymphocyte count: 1615 cells/uL (ref 850–3900)

## 2022-01-05 LAB — CBC WITH DIFFERENTIAL/PLATELET
Absolute Monocytes: 627 cells/uL (ref 200–950)
Basophils Absolute: 20 cells/uL (ref 0–200)
Basophils Relative: 0.4 %
Eosinophils Absolute: 219 cells/uL (ref 15–500)
Eosinophils Relative: 4.3 %
HCT: 46.1 % (ref 38.5–50.0)
Hemoglobin: 15.5 g/dL (ref 13.2–17.1)
Lymphs Abs: 1535 cells/uL (ref 850–3900)
MCH: 32.4 pg (ref 27.0–33.0)
MCHC: 33.6 g/dL (ref 32.0–36.0)
MCV: 96.2 fL (ref 80.0–100.0)
MPV: 10.8 fL (ref 7.5–12.5)
Monocytes Relative: 12.3 %
Neutro Abs: 2698 cells/uL (ref 1500–7800)
Neutrophils Relative %: 52.9 %
Platelets: 179 10*3/uL (ref 140–400)
RBC: 4.79 10*6/uL (ref 4.20–5.80)
RDW: 11.3 % (ref 11.0–15.0)
Total Lymphocyte: 30.1 %
WBC: 5.1 10*3/uL (ref 3.8–10.8)

## 2022-01-05 LAB — COMPREHENSIVE METABOLIC PANEL
AG Ratio: 1.5 (calc) (ref 1.0–2.5)
ALT: 30 U/L (ref 9–46)
AST: 22 U/L (ref 10–35)
Albumin: 4.6 g/dL (ref 3.6–5.1)
Alkaline phosphatase (APISO): 84 U/L (ref 35–144)
BUN: 14 mg/dL (ref 7–25)
CO2: 27 mmol/L (ref 20–32)
Calcium: 9.4 mg/dL (ref 8.6–10.3)
Chloride: 107 mmol/L (ref 98–110)
Creat: 1.12 mg/dL (ref 0.70–1.30)
Globulin: 3 g/dL (calc) (ref 1.9–3.7)
Glucose, Bld: 78 mg/dL (ref 65–99)
Potassium: 3.8 mmol/L (ref 3.5–5.3)
Sodium: 141 mmol/L (ref 135–146)
Total Bilirubin: 1 mg/dL (ref 0.2–1.2)
Total Protein: 7.6 g/dL (ref 6.1–8.1)

## 2022-01-05 LAB — HIV-1 RNA QUANT-NO REFLEX-BLD
HIV 1 RNA Quant: <20 Copies/mL — ABNORMAL HIGH
HIV-1 RNA Quant, Log: <1.3 Log cps/mL — ABNORMAL HIGH

## 2022-01-05 LAB — C. TRACHOMATIS/N. GONORRHOEAE RNA
C. trachomatis RNA, TMA: NOT DETECTED
N. gonorrhoeae RNA, TMA: NOT DETECTED

## 2022-01-05 LAB — RPR: RPR Ser Ql: NONREACTIVE

## 2022-03-07 ENCOUNTER — Ambulatory Visit (INDEPENDENT_AMBULATORY_CARE_PROVIDER_SITE_OTHER): Payer: PRIVATE HEALTH INSURANCE | Admitting: Pharmacist

## 2022-03-07 ENCOUNTER — Encounter: Payer: PRIVATE HEALTH INSURANCE | Admitting: Infectious Diseases

## 2022-03-07 ENCOUNTER — Telehealth: Payer: Self-pay

## 2022-03-07 ENCOUNTER — Other Ambulatory Visit: Payer: Self-pay

## 2022-03-07 ENCOUNTER — Other Ambulatory Visit (HOSPITAL_COMMUNITY): Payer: Self-pay

## 2022-03-07 DIAGNOSIS — B20 Human immunodeficiency virus [HIV] disease: Secondary | ICD-10-CM | POA: Diagnosis not present

## 2022-03-07 DIAGNOSIS — K5900 Constipation, unspecified: Secondary | ICD-10-CM

## 2022-03-07 MED ORDER — TRULANCE 3 MG PO TABS: 3.0000 mg | ORAL_TABLET | Freq: Every day | ORAL | 0 refills | Status: DC

## 2022-03-07 MED ORDER — CABOTEGRAVIR & RILPIVIRINE ER 600 & 900 MG/3ML IM SUER
1.0000 | Freq: Once | INTRAMUSCULAR | Status: AC
  Administered 2022-03-07: 1 via INTRAMUSCULAR

## 2022-03-07 NOTE — Progress Notes (Signed)
? ?HPI: Elijah Guerra is a 51 y.o. male who presents to the [facility] clinic for [place] administration. ? ?Patient Active Problem List  ? Diagnosis Date Noted  ? Nocturia more than twice per night 01/09/2021  ? Healthcare maintenance 11/06/2018  ? Hepatitis B immune 01/22/2017  ? Skin nodule 09/04/2015  ? Contact dermatitis 09/15/2014  ? Hyperglycemia 08/30/2013  ? Erectile dysfunction 08/30/2013  ? Arthritis 02/12/2012  ? GERD 12/29/2009  ? Constipation 12/29/2009  ? VOMITING 12/29/2009  ? PROTEINURIA, MILD 04/01/2007  ? Human immunodeficiency virus (HIV) disease ([facility]) 01/01/2007  ? DISORDER, TOBACCO USE 01/01/2007  ? ? ?Patient's Medications  ?New Prescriptions  ? No medications on file  ?Previous Medications  ? FLUOCINONIDE CREAM (LIDEX) 0.05 %    APPLY EXTERNALLY TO THE AFFECTED AREA TWICE DAILY THEN AS NEEDED FLARE / ITCHING  ? KETOCONAZOLE (NIZORAL) 2 % CREAM    Apply 1 application topically as needed.  ? PLECANATIDE (TRULANCE) 3 MG TABS    Take 3 mg by mouth daily.  ?Modified Medications  ? Modified Medication Previous Medication  ? PLECANATIDE (TRULANCE) 3 MG TABS Plecanatide (TRULANCE) 3 MG TABS  ?    Take 3 mg by mouth daily.    Take 3 mg by mouth daily.  ?Discontinued Medications  ? No medications on file  ? ? ?Allergies: ?No Known Allergies ? ?Past Medical History: ?Past Medical History:  ?Diagnosis Date  ? GERD (gastroesophageal reflux disease)   ? HIV (human immunodeficiency virus infection) ([facility])   ? ? ?Social History: ?Social History  ? ?Socioeconomic History  ? Marital status: Single  ?  Spouse name: Not on file  ? Number of children: Not on file  ? Years of education: Not on file  ? Highest education level: Not on file  ?Occupational History  ? Not on file  ?Tobacco Use  ? Smoking status: Some Days  ?  Packs/day: 0.30  ?  Types: Cigars, Cigarettes  ? Smokeless tobacco: Never  ?Vaping Use  ? Vaping Use: Never used  ?Substance and Sexual Activity  ? Alcohol use: No  ?  Alcohol/week: 0.0  standard drinks  ? Drug use: No  ? Sexual activity: Yes  ?  Partners: Male  ?  Birth control/protection: Condom  ?  Comment: refused condoms  ?Other Topics Concern  ? Not on file  ?Social History Narrative  ? Not on file  ? ?Social Determinants of Health  ? ?Financial Resource Strain: Not on file  ?Food Insecurity: Not on file  ?Transportation Needs: Not on file  ?Physical Activity: Not on file  ?Stress: Not on file  ?Social Connections: Not on file  ? ? ?Labs: ?Lab Results  ?Component Value Date  ? HIV1RNAQUANT <20 (H) 01/03/2022  ? HIV1RNAQUANT Not Detected 11/02/2021  ? HIV1RNAQUANT Not Detected 10/03/2021  ? CD4TABS 391 (L) 10/03/2021  ? CD4TABS 652 12/26/2020  ? CD4TABS 556 08/03/2020  ? ? ?RPR and STI ?Lab Results  ?Component Value Date  ? LABRPR NON-REACTIVE 01/03/2022  ? LABRPR REACTIVE (A) 08/03/2020  ? LABRPR NON-REACTIVE 07/14/2018  ? LABRPR NON-REACTIVE 09/08/2017  ? LABRPR NON REAC 01/22/2017  ? RPRTITER 1:1 (H) 08/03/2020  ? ? ?STI Results GC CT  ?10/03/2021 ?10:46 AM Negative    ? Negative   Negative    ? Negative    ?08/03/2020 ? 4:57 PM Negative   Negative    ?07/14/2018 ?12:00 AM Negative   Negative    ?09/08/2017 ?12:00 AM Negative  Negative    ?01/22/2017 ?12:00 AM Negative   Negative    ?03/01/2015 ?12:00 AM Negative   Negative    ? ? ?Hepatitis B ?Lab Results  ?Component Value Date  ? HEPBSAB POS (A) 09/04/2015  ? HEPBSAG NEGATIVE 09/04/2015  ? ?Hepatitis C ?No results found for: HEPCAB, HCVRNAPCRQN ?Hepatitis A ?Lab Results  ?Component Value Date  ? HAV NEG 12/29/2007  ? ?Lipids: ?Lab Results  ?Component Value Date  ? CHOL 173 08/03/2020  ? TRIG 173 (H) 08/03/2020  ? HDL 42 08/03/2020  ? CHOLHDL 4.1 08/03/2020  ? VLDL 30 01/22/2017  ? LDLCALC 102 (H) 08/03/2020  ? ? ?TARGET DATE: ?The 9th of the month ? ?Current HIV Regimen: ?Cabenuva ? ?Assessment: ?Elijah Guerra presents today for their maintenance Cabenuva injections. Past injections were tolerated well without issues. No problems with systemic side  effects of injections. No new partners since last injection, politely declined STI testing today.  ? ?Administered cabotegravir 600mg /87mL in left upper outer quadrant of the gluteal muscle. Administered rilpivirine 900 mg/71mL in the right upper outer quadrant of the gluteal muscle. Injections were tolerated well. Patient will follow up in 2 months for next set of injections. ? ?Of note, also discussed future options for HIV care with Cabenuva injections and follow-up. Elijah Guerra expressed that with Dr. Drucie Opitz leaving our clinic, he would like to establish care with another provider in our clinic. ? ?Plan: ?- Cabenuva injections administered ?- Check HIV RNA today ?- Next injections scheduled for 6/8 with Dr. 8/8 and 8/8 with 10/8 ?- Call with any issues or questions ? ? ?Elijah Guerra, PharmD ?PGY1 Pharmacy Resident ?03/07/2022 4:56 PM  ?

## 2022-03-07 NOTE — Telephone Encounter (Signed)
[facility] Patient Advocate Encounter ? ?Prior Authorization for [facility] has been approved.   ? ? ?Effective dates: 03/06/22 through 03/07/23 ? ?Patients co-pay is $60.00.  ? ?[facility] will continue to follow. ? ?Clearance Coots, CPhT ?Specialty Pharmacy Patient Advocate ?[facility] ?Phone: [phone] ?Fax:  [phone]  ?

## 2022-03-08 MED ORDER — CABOTEGRAVIR & RILPIVIRINE ER 600 & 900 MG/3ML IM SUER: 1.0000 | INTRAMUSCULAR | 1 refills | Status: DC

## 2022-03-09 LAB — HIV-1 RNA QUANT-NO REFLEX-BLD
HIV 1 RNA Quant: NOT DETECTED copies/mL
HIV-1 RNA Quant, Log: NOT DETECTED Log copies/mL

## 2022-03-11 ENCOUNTER — Encounter: Payer: Self-pay | Admitting: Infectious Diseases

## 2022-04-11 ENCOUNTER — Other Ambulatory Visit: Payer: Self-pay | Admitting: Infectious Diseases

## 2022-04-11 DIAGNOSIS — K5900 Constipation, unspecified: Secondary | ICD-10-CM

## 2022-04-12 ENCOUNTER — Other Ambulatory Visit: Payer: Self-pay

## 2022-04-12 ENCOUNTER — Other Ambulatory Visit: Payer: Self-pay | Admitting: Infectious Diseases

## 2022-04-12 NOTE — Telephone Encounter (Signed)
Okay to refill.  Thanks.

## 2022-04-28 ENCOUNTER — Encounter: Payer: Self-pay | Admitting: Infectious Diseases

## 2022-05-02 ENCOUNTER — Other Ambulatory Visit: Payer: Self-pay

## 2022-05-02 ENCOUNTER — Encounter: Payer: PRIVATE HEALTH INSURANCE | Admitting: Pharmacist

## 2022-05-02 ENCOUNTER — Ambulatory Visit (INDEPENDENT_AMBULATORY_CARE_PROVIDER_SITE_OTHER): Payer: PRIVATE HEALTH INSURANCE | Admitting: Infectious Diseases

## 2022-05-02 ENCOUNTER — Encounter: Payer: Self-pay | Admitting: Infectious Diseases

## 2022-05-02 VITALS — BP 148/92 | HR 82 | Temp 97.7°F | Wt 188.0 lb

## 2022-05-02 DIAGNOSIS — K5900 Constipation, unspecified: Secondary | ICD-10-CM | POA: Diagnosis not present

## 2022-05-02 DIAGNOSIS — Z79899 Other long term (current) drug therapy: Secondary | ICD-10-CM | POA: Diagnosis not present

## 2022-05-02 DIAGNOSIS — B20 Human immunodeficiency virus [HIV] disease: Secondary | ICD-10-CM

## 2022-05-02 DIAGNOSIS — Z113 Encounter for screening for infections with a predominantly sexual mode of transmission: Secondary | ICD-10-CM | POA: Diagnosis not present

## 2022-05-02 MED ORDER — CABOTEGRAVIR & RILPIVIRINE ER 600 & 900 MG/3ML IM SUER
1.0000 | Freq: Once | INTRAMUSCULAR | Status: AC
  Administered 2022-05-02: 1 via INTRAMUSCULAR

## 2022-05-02 NOTE — Progress Notes (Signed)
   Subjective:    Patient ID: Elijah Guerra, male  DOB: [birth year], 51 y.o.        MRN: [id number]   HPI 52 yo M with hx of HIV+, on atripla (first visit 03-2001?). He was changed atripla --> genvoya 09-2016. No problems with ART. He was switched to dovato 08-2020 then cabaneuva 05-2021.  Has been feeling well.  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   CD4 522 HIV 1 RNA Quant  Date Value  03/07/2022 NOT DETECTED copies/mL  01/03/2022 <20 Copies/mL (H)  11/02/2021 Not Detected Copies/mL   CD4 T Cell Abs (/uL)  Date Value  10/03/2021 391 (L)  12/26/2020 652  08/03/2020 556     Health Maintenance  Topic Date Due  . Zoster Vaccines- Shingrix (1 of 2) Never done  . COVID-19 Vaccine (4 - Booster for Pfizer series) 02/01/2021  . INFLUENZA VACCINE  06/25/2022  . TETANUS/TDAP  09/30/2028  . COLONOSCOPY (Pts 45-25yrs Insurance coverage will need to be confirmed)  09/29/2030  . Hepatitis C Screening  Completed  . HIV Screening  Completed  . HPV VACCINES  Aged Out      Review of Systems  Constitutional:  Negative for chills, fever and weight loss.  Eyes:  Positive for blurred vision (has ophtho f/u.).  Respiratory:  Negative for cough and shortness of breath.   Cardiovascular:  Negative for chest pain.  Gastrointestinal:  Positive for constipation (back on trulance). Negative for diarrhea.  Genitourinary:  Negative for dysuria.  Neurological:  Negative for headaches.    Please see HPI. All other systems reviewed and negative.     Objective:  Physical Exam Vitals reviewed.  Constitutional:      General: He is not in acute distress.    Appearance: Normal appearance. He is not ill-appearing or toxic-appearing.  HENT:     Mouth/Throat:     Mouth: Mucous membranes are moist.     Pharynx: No oropharyngeal exudate.  Eyes:     Extraocular Movements: Extraocular movements intact.     Pupils: Pupils are equal, round, and reactive to light.  Cardiovascular:     Rate and Rhythm: Normal rate  and regular rhythm.  Pulmonary:     Effort: Pulmonary effort is normal.     Breath sounds: Normal breath sounds.  Abdominal:     General: Bowel sounds are normal. There is no distension.     Palpations: Abdomen is soft.     Tenderness: There is no abdominal tenderness.  Musculoskeletal:        General: Normal range of motion.     Cervical back: Normal range of motion and neck supple.     Right lower leg: No edema.     Left lower leg: No edema.  Neurological:     General: No focal deficit present.     Mental Status: He is alert.          Assessment & Plan:

## 2022-05-02 NOTE — Assessment & Plan Note (Signed)
Refill his trulance as needed

## 2022-05-02 NOTE — Assessment & Plan Note (Addendum)
He is doing well Will continue on cabaneuva Offered/refused condoms.  rx for shingles vax rtc in 6 months

## 2022-05-02 NOTE — Addendum Note (Signed)
Addended by: Philippa Chester on: 05/02/2022 02:25 PM   Modules accepted: Orders

## 2022-05-19 ENCOUNTER — Encounter (HOSPITAL_COMMUNITY): Payer: Self-pay | Admitting: Emergency Medicine

## 2022-05-19 ENCOUNTER — Other Ambulatory Visit: Payer: Self-pay

## 2022-05-19 ENCOUNTER — Emergency Department (HOSPITAL_COMMUNITY)
Admission: EM | Admit: 2022-05-19 | Discharge: 2022-05-20 | Disposition: A | Discharge status: Home / Self Care | Payer: PRIVATE HEALTH INSURANCE | Attending: Emergency Medicine | Admitting: Emergency Medicine

## 2022-05-19 DIAGNOSIS — Z21 Asymptomatic human immunodeficiency virus [HIV] infection status: Secondary | ICD-10-CM | POA: Diagnosis not present

## 2022-05-19 DIAGNOSIS — K644 Residual hemorrhoidal skin tags: Secondary | ICD-10-CM | POA: Diagnosis not present

## 2022-05-19 DIAGNOSIS — K649 Unspecified hemorrhoids: Secondary | ICD-10-CM

## 2022-05-19 LAB — BASIC METABOLIC PANEL
Anion gap: 6 (ref 5–15)
BUN: 22 mg/dL — ABNORMAL HIGH (ref 6–20)
CO2: 28 mmol/L (ref 22–32)
Calcium: 9.5 mg/dL (ref 8.9–10.3)
Chloride: 112 mmol/L — ABNORMAL HIGH (ref 98–111)
Creatinine, Ser: 1.3 mg/dL — ABNORMAL HIGH (ref 0.61–1.24)
GFR, Estimated: >60 mL/min (ref >60)
Glucose, Bld: 102 mg/dL — ABNORMAL HIGH (ref 70–99)
Potassium: 3.8 mmol/L (ref 3.5–5.1)
Sodium: 146 mmol/L — ABNORMAL HIGH (ref 135–145)

## 2022-05-19 LAB — CBC
HCT: 43.8 % (ref 39.0–52.0)
Hemoglobin: 14.5 g/dL (ref 13.0–17.0)
MCH: 31.8 pg (ref 26.0–34.0)
MCHC: 33.1 g/dL (ref 30.0–36.0)
MCV: 96.1 fL (ref 80.0–100.0)
Platelets: 180 10*3/uL (ref 150–400)
RBC: 4.56 MIL/uL (ref 4.22–5.81)
RDW: 12.3 % (ref 11.5–15.5)
WBC: 7 10*3/uL (ref 4.0–10.5)
nRBC: 0 % (ref 0.0–0.2)

## 2022-05-19 MED ORDER — HYDROCORTISONE (PERIANAL) 2.5 % EX CREA: 1.0000 | TOPICAL_CREAM | Freq: Two times a day (BID) | CUTANEOUS | 0 refills | Status: DC

## 2022-05-19 NOTE — ED Provider Notes (Signed)
[facility]-EMERGENCY DEPT Provider Note   CSN: [id number] Arrival date & time: 05/19/22  1944     History  Chief Complaint  Patient presents with   Rectal Pain    Elijah Guerra is a 51 y.o. male.  Patient is a 51 year old male with past medical history of HIV disease, irritable bowel.  Patient presenting today with complaints of hemorrhoid.  He reports a painful, swollen area to the outside of his rectum.  He has noticed blood on occasion.  This has been worsening over the past 3 days.  He reports taking his usual irritable bowel medications and being compliant with them.  He denies any fevers or chills.  He denies any injury or trauma.  The history is provided by the patient.       Home Medications Prior to Admission medications   Medication Sig Start Date End Date Taking? Authorizing Provider  cabotegravir & rilpivirine ER (CABENUVA) 600 & 900 MG/3ML injection Inject 1 kit into the muscle every 30 (thirty) days. 03/08/22   Jennette Kettle, RPH-CPP  fluocinonide cream (LIDEX) 0.05 % APPLY EXTERNALLY TO THE AFFECTED AREA TWICE DAILY THEN AS NEEDED FLARE / ITCHING 01/05/20   [provider]  ketoconazole (NIZORAL) 2 % cream Apply 1 application topically as needed.    [provider]  Plecanatide (TRULANCE) 3 MG TABS TAKE 1 TABLET BY MOUTH DAILY FOR 21 DAYS AS DIRECTED 04/12/22   Ginnie Smart, MD  omeprazole (PRILOSEC OTC) 20 MG tablet Take 20 mg by mouth daily.  07/22/12  [provider]      Allergies    Patient has no known allergies.    Review of Systems   Review of Systems  All other systems reviewed and are negative.   Physical Exam Updated Vital Signs BP (!) 136/92   Pulse 98   Temp 98.2 F (36.8 C) (Oral)   Resp 17   Ht 5\' 5"  (1.651 m)   Wt 83.9 kg   SpO2 97%   BMI 30.79 kg/m  Physical Exam Vitals and nursing note reviewed.  Constitutional:      General: He is not in acute distress.    Appearance: He is  well-developed. He is not diaphoretic.  HENT:     Head: Normocephalic and atraumatic.  Cardiovascular:     Rate and Rhythm: Normal rate and regular rhythm.     Heart sounds: No murmur heard.    No friction rub.  Pulmonary:     Effort: Pulmonary effort is normal. No respiratory distress.     Breath sounds: Normal breath sounds. No wheezing or rales.  Abdominal:     General: Bowel sounds are normal. There is no distension.     Palpations: Abdomen is soft.     Tenderness: There is no abdominal tenderness.  Genitourinary:    Comments: There is a 2-1/2 cm, round, external hemorrhoid noted.  It is tender to the touch.  There is no active bleeding. Musculoskeletal:        General: Normal range of motion.     Cervical back: Normal range of motion and neck supple.  Skin:    General: Skin is warm and dry.  Neurological:     Mental Status: He is alert and oriented to person, place, and time.     Coordination: Coordination normal.     ED Results / Procedures / Treatments   Labs (all labs ordered are listed, but only abnormal results are displayed) Labs Reviewed  CBC  BASIC METABOLIC PANEL    EKG None  Radiology No results found.  Procedures Procedures    Medications Ordered in ED Medications - No data to display  ED Course/ Medical Decision Making/ A&P  Patient presenting here with a painful external hemorrhoid.  Patient will be given hydrocortisone cream and advised to alternate this with Preparation H.  He will also be recommended to take stool softeners.  If not improving in the next few days, he can follow-up with general surgery.  Contact information for [employer] given.  Patient has no white count and hemoglobin is normal.  Final Clinical Impression(s) / ED Diagnoses Final diagnoses:  None    Rx / DC Orders ED Discharge Orders     None         Geoffery Lyons, MD 05/19/22 2335

## 2022-05-29 ENCOUNTER — Other Ambulatory Visit (HOSPITAL_COMMUNITY): Payer: Self-pay

## 2022-07-02 ENCOUNTER — Other Ambulatory Visit: Payer: Self-pay

## 2022-07-02 ENCOUNTER — Ambulatory Visit (INDEPENDENT_AMBULATORY_CARE_PROVIDER_SITE_OTHER): Payer: PRIVATE HEALTH INSURANCE | Admitting: Pharmacist

## 2022-07-02 DIAGNOSIS — B20 Human immunodeficiency virus [HIV] disease: Secondary | ICD-10-CM | POA: Diagnosis not present

## 2022-07-02 MED ORDER — CABOTEGRAVIR & RILPIVIRINE ER 600 & 900 MG/3ML IM SUER
1.0000 | Freq: Once | INTRAMUSCULAR | Status: AC
  Administered 2022-07-02: 1 via INTRAMUSCULAR

## 2022-07-02 NOTE — Progress Notes (Signed)
07/02/2022  Elijah Guerra is a 51 y.o. male who presents to the [facility] clinic for HIV follow-up.  Patient Active Problem List   Diagnosis Date Noted   Nocturia more than twice per night 01/09/2021   Healthcare maintenance 11/06/2018   Hepatitis B immune 01/22/2017   Skin nodule 09/04/2015   Contact dermatitis 09/15/2014   Hyperglycemia 08/30/2013   Erectile dysfunction 08/30/2013   Arthritis 02/12/2012   GERD 12/29/2009   Constipation 12/29/2009   VOMITING 12/29/2009   PROTEINURIA, MILD 04/01/2007   Human immunodeficiency virus (HIV) disease ([place]) 01/01/2007   DISORDER, TOBACCO USE 01/01/2007    Patient's Medications  New Prescriptions   No medications on file  Previous Medications   CABOTEGRAVIR & RILPIVIRINE ER (CABENUVA) 600 & 900 MG/3ML INJECTION    Inject 1 kit into the muscle every 30 (thirty) days.   FLUOCINONIDE CREAM (LIDEX) 0.05 %    APPLY EXTERNALLY TO THE AFFECTED AREA TWICE DAILY THEN AS NEEDED FLARE / ITCHING   HYDROCORTISONE (ANUSOL-HC) 2.5 % RECTAL CREAM    Place 1 Application rectally 2 (two) times daily.   KETOCONAZOLE (NIZORAL) 2 % CREAM    Apply 1 application topically as needed.   PLECANATIDE (TRULANCE) 3 MG TABS    TAKE 1 TABLET BY MOUTH DAILY FOR 21 DAYS AS DIRECTED  Modified Medications   No medications on file  Discontinued Medications   No medications on file    Allergies: No Known Allergies  Past Medical History: Past Medical History:  Diagnosis Date   GERD (gastroesophageal reflux disease)    HIV (human immunodeficiency virus infection) ([place])     Social History: Social History   Socioeconomic History   Marital status: Single    Spouse name: Not on file   Number of children: Not on file   Years of education: Not on file   Highest education level: Not on file  Occupational History   Not on file  Tobacco Use   Smoking status: Some Days    Packs/day: 0.30    Types: Cigars, Cigarettes   Smokeless tobacco: Never  Vaping  Use   Vaping Use: Never used  Substance and Sexual Activity   Alcohol use: No    Alcohol/week: 0.0 standard drinks of alcohol   Drug use: No   Sexual activity: Yes    Partners: Male    Birth control/protection: Condom    Comment: refused condoms  Other Topics Concern   Not on file  Social History Narrative   Not on file   Social Determinants of Health   Financial Resource Strain: Not on file  Food Insecurity: Not on file  Transportation Needs: Not on file  Physical Activity: Not on file  Stress: Not on file  Social Connections: Not on file    Labs: Lab Results  Component Value Date   HIV1RNAQUANT NOT DETECTED 03/07/2022   HIV1RNAQUANT <20 (H) 01/03/2022   HIV1RNAQUANT Not Detected 11/02/2021   CD4TABS 391 (L) 10/03/2021   CD4TABS 652 12/26/2020   CD4TABS 556 08/03/2020    RPR and STI Lab Results  Component Value Date   LABRPR NON-REACTIVE 01/03/2022   LABRPR REACTIVE (A) 08/03/2020   LABRPR NON-REACTIVE 07/14/2018   LABRPR NON-REACTIVE 09/08/2017   LABRPR NON REAC 01/22/2017   RPRTITER 1:1 (H) 08/03/2020    STI Results GC CT  10/03/2021 10:46 AM Negative    Negative  Negative    Negative   08/03/2020  4:57 PM Negative  Negative   07/14/2018  12:00 AM Negative  Negative   09/08/2017 12:00 AM Negative  Negative   01/22/2017 12:00 AM Negative  Negative   03/01/2015 12:00 AM Negative  Negative     Hepatitis B Lab Results  Component Value Date   HEPBSAB POS (A) 09/04/2015   HEPBSAG NEGATIVE 09/04/2015   Hepatitis C No results found for: "HEPCAB", "HCVRNAPCRQN" Hepatitis A Lab Results  Component Value Date   HAV NEG 12/29/2007   Lipids: Lab Results  Component Value Date   CHOL 173 08/03/2020   TRIG 173 (H) 08/03/2020   HDL 42 08/03/2020   CHOLHDL 4.1 08/03/2020   VLDL 30 01/22/2017   LDLCALC 102 (H) 08/03/2020    Current HIV Regimen: Cabenuva IM Q2 months   Target date: the 9th of each month   Assessment: 68 YOM presenting for HIV f/u on  Cabenuva. Patient reports no issues with previous injections during the visit. However, he does endorse some right arm pain which has been ongoing over the past month, for which he was advised to see a PCP. Patient was a former patient of Dr. Johnnye Sima and was provided with his contact information in the event he wished to establish care with Dr. Johnnye Sima as his PCP, however he was advised he would have to see an ID provider to continue to receive Canbenuva injections at the [facility] clinic. He stated that he understood and was OK with establishing care with Dr. Juleen China as his ID provider.   His Hepatitis A antibody was non-reactive in 2016 and was not re-checked following immunization. As such, this was re-checked during the visit.   Patient was asked if he had any questions or concerns during this visit and he responded he had no questions or concerns at this time.   STI screening performed this visit:  '[x]'  RPR Patient declined any additional STI testing during this visit.   Plan: F/U RPR, HIV RNA, HAV Ab   F/U with Dr. Juleen China in 2 months (08/28/2022)   Adria Dill, PharmD PGY-2 Infectious Diseases Resident  07/02/2022 2:10 PM

## 2022-07-02 NOTE — Patient Instructions (Signed)
[facility]  Address:  [address] [place],  [place]  [address]  Phone: [phone]

## 2022-07-04 LAB — RPR: RPR Ser Ql: NONREACTIVE

## 2022-07-04 LAB — HIV-1 RNA QUANT-NO REFLEX-BLD
HIV 1 RNA Quant: 81 Copies/mL — ABNORMAL HIGH
HIV-1 RNA Quant, Log: 1.91 Log cps/mL — ABNORMAL HIGH

## 2022-07-04 LAB — HEPATITIS A ANTIBODY, TOTAL: Hepatitis A AB,Total: REACTIVE — AB

## 2022-07-05 ENCOUNTER — Other Ambulatory Visit: Payer: Self-pay | Admitting: Pharmacist

## 2022-07-05 DIAGNOSIS — B20 Human immunodeficiency virus [HIV] disease: Secondary | ICD-10-CM

## 2022-07-05 NOTE — Progress Notes (Signed)
Followed up with patient today. He will come in towards the end of August for repeat testing. Future order placed. Thanks! Marchelle Folks

## 2022-07-05 NOTE — Progress Notes (Signed)
Patient previously followed by Dr. Ninetta Lights; now switching to Dr. Earlene Plater in October. Will message Dr. Earlene Plater regarding patient's slightly increased viral load which has otherwise been undetectable while on Cabenuva. He has been on Cabenuva for ~9 months. Will continue to monitor HIV RNA.  Margarite Gouge, PharmD, CPP, BCIDP Clinical Pharmacist Practitioner Infectious Diseases Clinical Pharmacist [facility]

## 2022-07-17 ENCOUNTER — Other Ambulatory Visit: Payer: Self-pay

## 2022-07-17 ENCOUNTER — Other Ambulatory Visit: Payer: PRIVATE HEALTH INSURANCE

## 2022-07-17 DIAGNOSIS — B20 Human immunodeficiency virus [HIV] disease: Secondary | ICD-10-CM

## 2022-07-20 LAB — HIV-1 RNA QUANT-NO REFLEX-BLD
HIV 1 RNA Quant: <20 Copies/mL — ABNORMAL HIGH
HIV-1 RNA Quant, Log: <1.3 Log cps/mL — ABNORMAL HIGH

## 2022-07-24 ENCOUNTER — Ambulatory Visit (HOSPITAL_COMMUNITY)
Admission: EM | Admit: 2022-07-24 | Discharge: 2022-07-24 | Disposition: A | Discharge status: Home / Self Care | Payer: PRIVATE HEALTH INSURANCE | Attending: Sports Medicine | Admitting: Sports Medicine

## 2022-07-24 ENCOUNTER — Encounter (HOSPITAL_COMMUNITY): Payer: Self-pay | Admitting: Emergency Medicine

## 2022-07-24 DIAGNOSIS — M7712 Lateral epicondylitis, left elbow: Secondary | ICD-10-CM | POA: Diagnosis not present

## 2022-07-24 MED ORDER — METHYLPREDNISOLONE 4 MG PO TBPK
ORAL_TABLET | ORAL | 0 refills | Status: DC
Start: 2022-07-24 — End: 2022-09-02

## 2022-07-24 NOTE — Discharge Instructions (Signed)
Take medicine as directed on box Ice the elbow or try a brace for comfort  Avoid heavy lifting for a few weeks Follow up with primary care doctor if not improving

## 2022-07-24 NOTE — ED Provider Notes (Signed)
[facility]   [id number] 07/24/22 Arrival Time: 1109  ASSESSMENT & PLAN:  1. Lateral epicondylitis of left elbow    -History and exam consistent with lateral epicondylitis.  I have given him a script for a Medrol Dosepak to help with inflammation.  He was also given home exercises to work on strengthening.  I also recommended general ice, rest, and oral anti-inflammatories as needed after he finishes the Dosepak.  All of his questions were answered and he agrees to plan.  Meds ordered this encounter  Medications   methylPREDNISolone (MEDROL DOSEPAK) 4 MG TBPK tablet    Sig: Take as directed on box    Dispense:  21 tablet    Refill:  0     Discharge Instructions      Take medicine as directed on box Ice the elbow or try a brace for comfort  Avoid heavy lifting for a few weeks Follow up with primary care doctor if not improving      Follow-up Information     Ginnie Smart, MD.   Specialty: Infectious Diseases Why: If symptoms worsen Contact information: [address] [place] [address] [phone]                  Reviewed expectations re: course of current medical issues. Questions answered. Outlined signs and symptoms indicating need for more acute intervention. Patient verbalized understanding. After Visit Summary given.   SUBJECTIVE: Pleasant 51 year old male here for evaluation of elbow pain.  Has been present off and on for a number of months now.  He is reports most of his pain is at the outside part of the elbow.  It is a dull ache that is worse with lifting or flexion of the elbow.  He has tried taking ibuprofen for this in the past but that seems to return.  He has not tried any therapies or braces.  Denies any trauma or prior injuries to the elbow.  Denies any numbness or tingling down the arm.  No LMP for male patient. Past Surgical History:  Procedure Laterality Date   HERNIA REPAIR     childhood      OBJECTIVE:  Vitals:   07/24/22 1233  BP: (!) 155/101  Pulse: 72  Resp: 17  Temp: 97.9 F (36.6 C)  TempSrc: Oral  SpO2: 95%     Physical Exam Vitals reviewed.  Constitutional:      General: He is not in acute distress. Cardiovascular:     Rate and Rhythm: Normal rate.  Pulmonary:     Effort: Pulmonary effort is normal.  Musculoskeletal:     Comments: L elbow -no overlying erythema, or ecchymoses.  No effusion.  Tender to palpation at lateral epicondyle.  Nontender to palpation of medial epicondyle.  Full range of motion at elbow flexion extension.  Pain with resisted elbow flexion at the lateral epicondyle.  Pain with resisted wrist flexion at the lateral epicondyle.  No ligamentous instability with valgus varus testing.  Neurovascular intact distally  Neurological:     Mental Status: He is alert.      Labs: Results for orders placed or performed in visit on 07/17/22  HIV 1 RNA quant-no reflex-bld  Result Value Ref Range   HIV 1 RNA Quant <20 (H) Copies/mL   HIV-1 RNA Quant, Log <1.30 (H) Log cps/mL   Labs Reviewed - No data to display  Imaging: No results found.   No Known Allergies  Past Medical History:  Diagnosis Date   GERD (gastroesophageal reflux disease)    HIV (human immunodeficiency virus infection) (HCC)     Social History   Socioeconomic History   Marital status: Single    Spouse name: Not on file   Number of children: Not on file   Years of education: Not on file   Highest education level: Not on file  Occupational History   Not on file  Tobacco Use   Smoking status: Some Days    Packs/day: 0.30    Types: Cigars, Cigarettes   Smokeless tobacco: Never  Vaping Use   Vaping Use: Never used  Substance and Sexual Activity   Alcohol use: No    Alcohol/week: 0.0 standard drinks of alcohol   Drug use: No   Sexual activity: Yes    Partners: Male    Birth control/protection: Condom     Comment: refused condoms  Other Topics Concern   Not on file  Social History Narrative   Not on file   Social Determinants of Health   Financial Resource Strain: Not on file  Food Insecurity: Not on file  Transportation Needs: Not on file  Physical Activity: Not on file  Stress: Not on file  Social Connections: Not on file  Intimate Partner Violence: Not on file    Family History  Problem Relation Age of Onset   Heart disease Mother    Prostate cancer Father    Colon cancer Neg Hx    Esophageal cancer Neg Hx    Rectal cancer Neg Hx    Stomach cancer Neg Hx       Raquel Sayres, Baldemar Friday, MD 07/24/22 1334

## 2022-07-24 NOTE — ED Triage Notes (Signed)
Pt c/o left elbow pain for about a month. Denies injury, fall, lifting etc. Pain getting worse.

## 2022-08-28 ENCOUNTER — Encounter: Payer: PRIVATE HEALTH INSURANCE | Admitting: Pharmacist

## 2022-08-30 ENCOUNTER — Other Ambulatory Visit: Payer: Self-pay

## 2022-08-30 ENCOUNTER — Ambulatory Visit (INDEPENDENT_AMBULATORY_CARE_PROVIDER_SITE_OTHER): Payer: PRIVATE HEALTH INSURANCE | Admitting: Pharmacist

## 2022-08-30 DIAGNOSIS — Z23 Encounter for immunization: Secondary | ICD-10-CM | POA: Diagnosis not present

## 2022-08-30 DIAGNOSIS — B20 Human immunodeficiency virus [HIV] disease: Secondary | ICD-10-CM

## 2022-08-30 MED ORDER — CABOTEGRAVIR & RILPIVIRINE ER 600 & 900 MG/3ML IM SUER
1.0000 | Freq: Once | INTRAMUSCULAR | Status: AC
  Administered 2022-08-30: 1 via INTRAMUSCULAR

## 2022-08-30 NOTE — Progress Notes (Signed)
HPI: Elijah Guerra is a 51 y.o. male who presents to the [place] clinic for Alda administration.  Patient Active Problem List   Diagnosis Date Noted   Nocturia more than twice per night 01/09/2021   Healthcare maintenance 11/06/2018   Hepatitis B immune 01/22/2017   Skin nodule 09/04/2015   Contact dermatitis 09/15/2014   Hyperglycemia 08/30/2013   Erectile dysfunction 08/30/2013   Arthritis 02/12/2012   GERD 12/29/2009   Constipation 12/29/2009   VOMITING 12/29/2009   PROTEINURIA, MILD 04/01/2007   Human immunodeficiency virus (HIV) disease ([place]) 01/01/2007   DISORDER, TOBACCO USE 01/01/2007    Patient's Medications  New Prescriptions   No medications on file  Previous Medications   CABOTEGRAVIR & RILPIVIRINE ER (CABENUVA) 600 & 900 MG/3ML INJECTION    Inject 1 kit into the muscle every 30 (thirty) days.   FLUOCINONIDE CREAM (LIDEX) 0.05 %    APPLY EXTERNALLY TO THE AFFECTED AREA TWICE DAILY THEN AS NEEDED FLARE / ITCHING   HYDROCORTISONE (ANUSOL-HC) 2.5 % RECTAL CREAM    Place 1 Application rectally 2 (two) times daily.   KETOCONAZOLE (NIZORAL) 2 % CREAM    Apply 1 application topically as needed.   METHYLPREDNISOLONE (MEDROL DOSEPAK) 4 MG TBPK TABLET    Take as directed on box   PLECANATIDE (TRULANCE) 3 MG TABS    TAKE 1 TABLET BY MOUTH DAILY FOR 21 DAYS AS DIRECTED  Modified Medications   No medications on file  Discontinued Medications   No medications on file    Allergies: No Known Allergies  Past Medical History: Past Medical History:  Diagnosis Date   GERD (gastroesophageal reflux disease)    HIV (human immunodeficiency virus infection) ([place])     Social History: Social History   Socioeconomic History   Marital status: Single    Spouse name: Not on file   Number of children: Not on file   Years of education: Not on file   Highest education level: Not on file  Occupational History   Not on file  Tobacco Use   Smoking status: Some Days     Packs/day: 0.30    Types: Cigars, Cigarettes   Smokeless tobacco: Never  Vaping Use   Vaping Use: Never used  Substance and Sexual Activity   Alcohol use: No    Alcohol/week: 0.0 standard drinks of alcohol   Drug use: No   Sexual activity: Yes    Partners: Male    Birth control/protection: Condom    Comment: refused condoms  Other Topics Concern   Not on file  Social History Narrative   Not on file   Social Determinants of Health   Financial Resource Strain: Not on file  Food Insecurity: Not on file  Transportation Needs: Not on file  Physical Activity: Not on file  Stress: Not on file  Social Connections: Not on file    Labs: Lab Results  Component Value Date   HIV1RNAQUANT <20 (H) 07/17/2022   HIV1RNAQUANT 81 (H) 07/02/2022   HIV1RNAQUANT NOT DETECTED 03/07/2022   CD4TABS 391 (L) 10/03/2021   CD4TABS 652 12/26/2020   CD4TABS 556 08/03/2020    RPR and STI Lab Results  Component Value Date   LABRPR NON-REACTIVE 07/02/2022   LABRPR NON-REACTIVE 01/03/2022   LABRPR REACTIVE (A) 08/03/2020   LABRPR NON-REACTIVE 07/14/2018   LABRPR NON-REACTIVE 09/08/2017   RPRTITER 1:1 (H) 08/03/2020    STI Results GC CT  10/03/2021 10:46 AM Negative    Negative  Negative  Negative   08/03/2020  4:57 PM Negative  Negative   07/14/2018 12:00 AM Negative  Negative   09/08/2017 12:00 AM Negative  Negative   01/22/2017 12:00 AM Negative  Negative   03/01/2015 12:00 AM Negative  Negative     Hepatitis B Lab Results  Component Value Date   HEPBSAB POS (A) 09/04/2015   HEPBSAG NEGATIVE 09/04/2015   Hepatitis C No results found for: "HEPCAB", "HCVRNAPCRQN" Hepatitis A Lab Results  Component Value Date   HAV REACTIVE (A) 07/02/2022   Lipids: Lab Results  Component Value Date   CHOL 173 08/03/2020   TRIG 173 (H) 08/03/2020   HDL 42 08/03/2020   CHOLHDL 4.1 08/03/2020   VLDL 30 01/22/2017   LDLCALC 102 (H) 08/03/2020    TARGET DATE: 9th  Assessment: Elijah Guerra  presents today for his maintenance Cabenuva injections. Past injections were tolerated well without issues. Patient last seen in clinic 8/8. On 8/8 Hepatitis A Ab was also obtained which resulted as reactive suggesting immunity and RPR obtained and was non-reactive. He also experienced a viral blip where HIV VL was 81. When re-checked on 8/23, HIV VL returned as undetectable. He reports having no new partners since last visit and politely declines STI testing at this time.   Administered cabotegravir 670m/3mL in left upper outer quadrant of the gluteal muscle. Administered rilpivirine 900 mg/329min the right upper outer quadrant of the gluteal muscle. No issues with injections. He will follow up in 2 months for next set of injections.  Plan: - Cabenuva injections administered - Annual flu vaccine administered  - Check HIV RNA today. If undetectable, can return to checking at every other injection  - Next injections scheduled for 10/29/22 - Scheduled patient to be seen by Dr. WaJuleen China2/5/23 as well  - Call with any issues or questions  CoEliseo GumPharmD PGY1 Pharmacy Resident   08/30/2022  10:25 AM

## 2022-09-01 LAB — HIV-1 RNA QUANT-NO REFLEX-BLD
HIV 1 RNA Quant: 1930 Copies/mL — ABNORMAL HIGH
HIV-1 RNA Quant, Log: 3.29 Log cps/mL — ABNORMAL HIGH

## 2022-09-02 ENCOUNTER — Telehealth: Payer: Self-pay

## 2022-09-02 DIAGNOSIS — B20 Human immunodeficiency virus [HIV] disease: Secondary | ICD-10-CM

## 2022-09-02 NOTE — Addendum Note (Signed)
Addended by: Elsie Amis on: 09/02/2022 03:27 PM   Modules accepted: Orders

## 2022-09-02 NOTE — Telephone Encounter (Signed)
Called patient to discuss HIV viral load obtained at visit on 08/30/22. Unfortunately, HIV level has increased again to 1,930. Informed patient that we would like him to return for resistance testing. Scheduled appointment for 10/11 at 2pm. Also discussed that once these labs are obtained, we will have him start taking Symtuza once daily with food. Informed him to monitor for any rash when starting this medication. Informed him that [employer]ules Husbands will cover him for HIV in the meantime while we wait on resistance testing which may take a few weeks to return. Patient asked if Kern Reap would still be an option for him. Discussed that this would be a possibility pending the results of his resistance testing but ultimately will be up to his provider. Will send in [place] once he comes in for labs.   Eliseo Gum, PharmD PGY1 Pharmacy Resident   09/02/2022  3:23 PM

## 2022-09-04 ENCOUNTER — Other Ambulatory Visit: Payer: PRIVATE HEALTH INSURANCE

## 2022-09-04 ENCOUNTER — Other Ambulatory Visit (HOSPITAL_COMMUNITY): Payer: Self-pay

## 2022-09-04 ENCOUNTER — Other Ambulatory Visit: Payer: Self-pay

## 2022-09-04 DIAGNOSIS — B20 Human immunodeficiency virus [HIV] disease: Secondary | ICD-10-CM

## 2022-09-04 MED ORDER — SYMTUZA 800-150-200-10 MG PO TABS: 1.0000 | ORAL_TABLET | Freq: Every day | ORAL | 2 refills | Status: DC

## 2022-09-04 NOTE — Addendum Note (Signed)
Addended by: Elsie Amis on: 09/04/2022 02:29 PM   Modules accepted: Orders

## 2022-09-04 NOTE — Telephone Encounter (Signed)
Elijah Guerra came in today for resistance testing labs. Counseled him in person about adverse effects to look for. Emphasized the importance of taking Symtuza once daily with food. Informed him to let us know if he plans on taking any new medications so that we can check for interactions. Sent Symtuza prescription to his pharmacy.   Eliseo Gum, PharmD PGY1 Pharmacy Resident   09/04/2022  2:27 PM

## 2022-09-14 LAB — HIV-1 INTEGRASE GENOTYPE

## 2022-09-14 LAB — HIV RNA, RTPCR W/R GT (RTI, PI,INT)
HIV 1 RNA Quant: 2290 copies/mL — ABNORMAL HIGH
HIV-1 RNA Quant, Log: 3.36 Log copies/mL — ABNORMAL HIGH

## 2022-09-14 LAB — HIV-1 GENOTYPE: HIV-1 Genotype: DETECTED — AB

## 2022-09-16 ENCOUNTER — Telehealth: Payer: Self-pay | Admitting: Pharmacist

## 2022-09-16 NOTE — Progress Notes (Signed)
FYI

## 2022-09-16 NOTE — Telephone Encounter (Signed)
Cumulative HIV Genotype Data from 09/04/22  RT Mutations  K101E, R211K  PI Mutations    Integrase Mutations  Q148R, N155H, G163T   Interpretation of Genotype Data per [employer] HIV Drug Resistance Database:  Nucleoside RTIs  Abacavir - susceptible Zidovudine - susceptible Emtricitabine - susceptible Lamivudine - susceptible Tenofovir - susceptible   Non-Nucleoside RTIs  Doravirine - low level resistance Efavirenz - low level resistance Etravirine - low level resistance Nevirapine - intermediate resistance Rilpivirine - intermediate resistance   Protease Inhibitors  Atazanavir - susceptible Darunavir - susceptible Lopinavir - susceptible   Integrase Inhibitors  Bictegravir - intermediate resistance Cabotegravir - high level resistance Dolutegravir - intermediate resistance Elvitegravir - high level resistance Raltegravir - high level resistance   Patient has been receiving Cabenuva since November 2022. He has not missed a single dose or target date. He was switched to [place] last week.  Slyvester Latona L. Dalante Minus, PharmD, BCIDP, AAHIVP, CPP Clinical Pharmacist Practitioner Infectious Diseases [facility] 09/16/2022, 9:46 AM

## 2022-10-08 ENCOUNTER — Telehealth: Payer: Self-pay

## 2022-10-08 ENCOUNTER — Other Ambulatory Visit (HOSPITAL_COMMUNITY): Payer: Self-pay

## 2022-10-08 NOTE — Telephone Encounter (Signed)
[facility] Patient Advocate Encounter  [phone] will need a Predetermination I faxed Chart Notes and Labs , Ptice of drug and date of service ATTN: Angie C.  Fax # [phone]  Ref # [id number]  Clearance Coots, CPhT Specialty Pharmacy Patient [facility] for Infectious Disease Phone: [phone] Fax:  [phone]

## 2022-10-09 NOTE — Telephone Encounter (Signed)
Please disregard. Patient is no longer on Cabenuva. Thanks donna!

## 2022-10-15 ENCOUNTER — Telehealth: Payer: Self-pay

## 2022-10-15 NOTE — Telephone Encounter (Signed)
[facility] Patient Advocate Encounter  Prior Authorization for Elijah Guerra has been approved.    PA# [id number] Effective dates: 07/02/22 through 07/03/23    [facility] Clinic will continue to follow.  Clearance Coots, CPhT Specialty Pharmacy Patient [facility] for Infectious Disease Phone: [phone] Fax:  [phone]

## 2022-10-21 ENCOUNTER — Other Ambulatory Visit: Payer: Self-pay | Admitting: Pharmacist

## 2022-10-23 ENCOUNTER — Encounter: Payer: Self-pay | Admitting: Pharmacist

## 2022-10-29 ENCOUNTER — Encounter: Payer: PRIVATE HEALTH INSURANCE | Admitting: Pharmacist

## 2022-10-29 ENCOUNTER — Other Ambulatory Visit: Payer: Self-pay

## 2022-10-29 ENCOUNTER — Ambulatory Visit (INDEPENDENT_AMBULATORY_CARE_PROVIDER_SITE_OTHER): Payer: PRIVATE HEALTH INSURANCE | Admitting: Internal Medicine

## 2022-10-29 ENCOUNTER — Encounter: Payer: Self-pay | Admitting: Internal Medicine

## 2022-10-29 VITALS — BP 142/86 | HR 94 | Temp 98.2°F | Ht 66.0 in | Wt 193.0 lb

## 2022-10-29 DIAGNOSIS — B20 Human immunodeficiency virus [HIV] disease: Secondary | ICD-10-CM | POA: Diagnosis not present

## 2022-10-29 MED ORDER — SYMTUZA 800-150-200-10 MG PO TABS: 1.0000 | ORAL_TABLET | Freq: Every day | ORAL | 11 refills | Status: DC

## 2022-10-29 NOTE — Assessment & Plan Note (Signed)
Patient here today for HIV follow up.  He was previously seen by Dr Ninetta Lights and had good long term control.  His prior regimens included Atripla, Darrin Luis, Dovato, and then [place] starting in July 2022.  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  His genotype was performed at that time as well revealing K101E resistance to RPV and pan-INSTI resistance.  He is tolerating his Symtuza at this time without side effects or other issues.  Will obtain repeat labs today and continue Symtuza.  Refills sent and follow up in 6 months.

## 2022-10-29 NOTE — Progress Notes (Signed)
[facility] for Infectious Disease   CHIEF COMPLAINT    HIV follow up.    SUBJECTIVE:    Elijah Guerra is a 51 y.o. male with PMHx as below who presents to the clinic for HIV follow up.   Please see A&P for the details of today's visit and status of the patient's medical problems.   Patient's Medications  New Prescriptions   No medications on file  Previous Medications   HYDROCORTISONE (ANUSOL-HC) 2.5 % RECTAL CREAM    Place 1 Application rectally 2 (two) times daily.   PLECANATIDE (TRULANCE) 3 MG TABS    TAKE 1 TABLET BY MOUTH DAILY FOR 21 DAYS AS DIRECTED  Modified Medications   Modified Medication Previous Medication   DARUNAVIR-COBICISTAT-EMTRICITABINE-TENOFOVIR ALAFENAMIDE (SYMTUZA) 800-150-200-10 MG TABS Darunavir-Cobicistat-Emtricitabine-Tenofovir Alafenamide (SYMTUZA) 800-150-200-10 MG TABS      Take 1 tablet by mouth daily with breakfast.    Take 1 tablet by mouth daily with breakfast.  Discontinued Medications   FLUOCINONIDE CREAM (LIDEX) 0.05 %    APPLY EXTERNALLY TO THE AFFECTED AREA TWICE DAILY THEN AS NEEDED FLARE / ITCHING   KETOCONAZOLE (NIZORAL) 2 % CREAM    Apply 1 application topically as needed.      Past Medical History:  Diagnosis Date   GERD (gastroesophageal reflux disease)    HIV (human immunodeficiency virus infection) ([place])     Social History   Tobacco Use   Smoking status: Some Days    Packs/day: 0.30    Types: Cigars, Cigarettes   Smokeless tobacco: Never   Tobacco comments:    Social smoking   Vaping Use   Vaping Use: Never used  Substance Use Topics   Alcohol use: No    Alcohol/week: 0.0 standard drinks of alcohol   Drug use: No    Family History  Problem Relation Age of Onset   Heart disease Mother    Prostate cancer Father    Colon cancer Neg Hx    Esophageal cancer Neg Hx    Rectal cancer Neg Hx    Stomach cancer Neg Hx     No Known Allergies  Review of Systems  Constitutional: Negative.   Respiratory:  Negative.    Cardiovascular: Negative.   Gastrointestinal: Negative.      OBJECTIVE:    Vitals:   10/29/22 1028  BP: (!) 142/86  Pulse: 94  Temp: 98.2 F (36.8 C)  TempSrc: Oral  SpO2: 95%  Weight: 193 lb (87.5 kg)  Height: 5\' 6"  (1.676 m)     Body mass index is 31.15 kg/m.  Physical Exam Constitutional:      Appearance: Normal appearance.  HENT:     Head: Normocephalic and atraumatic.  Eyes:     Extraocular Movements: Extraocular movements intact.     Conjunctiva/sclera: Conjunctivae normal.  Pulmonary:     Effort: Pulmonary effort is normal. No respiratory distress.  Abdominal:     General: There is no distension.     Palpations: Abdomen is soft.  Musculoskeletal:        General: Normal range of motion.  Skin:    General: Skin is warm and dry.  Neurological:     General: No focal deficit present.     Mental Status: He is alert and oriented to person, place, and time.  Psychiatric:        Mood and Affect: Mood normal.        Behavior: Behavior normal.     Labs and  Microbiology:    Latest Ref Rng & Units 05/19/2022   10:58 PM 01/03/2022   12:01 PM 10/03/2021   10:50 AM  CMP  Glucose 70 - 99 mg/dL 264  78  71   BUN 6 - 20 mg/dL 22  14  15    Creatinine 0.61 - 1.24 mg/dL  1.58  3.09   Sodium 135 - 145 mmol/L 146  141  146   Potassium 3.5 - 5.1 mmol/L 3.8  3.8  3.9   Chloride 98 - 111 mmol/L 112  107  108   CO2 22 - 32 mmol/L 28  27  26    Calcium 8.9 - 10.3 mg/dL 9.5  9.4  9.7   Total Protein 6.1 - 8.1 g/dL  7.6  7.5   Total Bilirubin 0.2 - 1.2 mg/dL  1.0  0.9   AST 10 - 35 U/L  22  20   ALT 9 - 46 U/L  30  22       Latest Ref Rng & Units 05/19/2022   10:58 PM 01/03/2022   12:01 PM 10/03/2021   10:50 AM  CBC  WBC 4.0 - 10.5 K/uL 7.0  5.1  5.5   Hemoglobin 13.0 - 17.0 g/dL 03/03/2022  13/07/2021  68.0   Hematocrit 39.0 - 52.0 % 43.8  46.1  46.7   Platelets 150 - 400 K/uL 180  179  190      Lab Results  Component Value Date   HIV1RNAQUANT 2,290 (H)  09/04/2022   HIV1RNAQUANT 1,930 (H) 08/30/2022   HIV1RNAQUANT <20 (H) 07/17/2022   CD4TABS 391 (L) 10/03/2021   CD4TABS 652 12/26/2020   CD4TABS 556 08/03/2020    RPR and STI: Lab Results  Component Value Date   LABRPR NON-REACTIVE 07/02/2022   LABRPR NON-REACTIVE 01/03/2022   LABRPR REACTIVE (A) 08/03/2020   LABRPR NON-REACTIVE 07/14/2018   LABRPR NON-REACTIVE 09/08/2017   RPRTITER 1:1 (H) 08/03/2020    STI Results GC CT  10/03/2021 10:46 AM Negative    Negative  Negative    Negative   08/03/2020  4:57 PM Negative  Negative   07/14/2018 12:00 AM Negative  Negative   09/08/2017 12:00 AM Negative  Negative   01/22/2017 12:00 AM Negative  Negative   03/01/2015 12:00 AM Negative  Negative     Hepatitis B: Lab Results  Component Value Date   HEPBSAB POS (A) 09/04/2015   HEPBSAG NEGATIVE 09/04/2015   Hepatitis C: No results found for: "HEPCAB", "HCVRNAPCRQN" Hepatitis A: Lab Results  Component Value Date   HAV REACTIVE (A) 07/02/2022   Lipids: Lab Results  Component Value Date   CHOL 173 08/03/2020   TRIG 173 (H) 08/03/2020   HDL 42 08/03/2020   CHOLHDL 4.1 08/03/2020   VLDL 30 01/22/2017   LDLCALC 102 (H) 08/03/2020      ASSESSMENT & PLAN:    Human immunodeficiency virus (HIV) disease Patient here today for HIV follow up.  He was previously seen by Dr 01/24/2017 and had good long term control.  His prior regimens included Atripla, 10/03/2020, Dovato, and then Ninetta Lights starting in July 2022.  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  His genotype was performed at that time as well revealing K101E resistance to RPV and pan-INSTI resistance.  He is tolerating his Symtuza at this time without side effects or other issues.  Will obtain repeat labs today and continue Symtuza.  Refills sent and follow up in 6 months.    Orders Placed This Encounter  Procedures   HIV-1 RNA quant-no  reflex-bld   T-helper cell (CD4)- ([facility] clinic only)   COMPLETE METABOLIC PANEL WITH GFR   [place] [facility] 10/29/2022, 10:40 AM

## 2022-10-30 LAB — T-HELPER CELL (CD4) - (RCID CLINIC ONLY)
CD4 % Helper T Cell: 35 % (ref 33–65)
CD4 T Cell Abs: 534 /uL (ref 400–1790)

## 2022-10-31 LAB — COMPLETE METABOLIC PANEL WITH GFR
AG Ratio: 1.4 (calc) (ref 1.0–2.5)
ALT: 29 U/L (ref 9–46)
AST: 24 U/L (ref 10–35)
Albumin: 4.3 g/dL (ref 3.6–5.1)
Alkaline phosphatase (APISO): 76 U/L (ref 35–144)
BUN/Creatinine Ratio: 15 (calc) (ref 6–22)
BUN: 20 mg/dL (ref 7–25)
CO2: 30 mmol/L (ref 20–32)
Calcium: 9.3 mg/dL (ref 8.6–10.3)
Chloride: 107 mmol/L (ref 98–110)
Creat: 1.36 mg/dL — ABNORMAL HIGH (ref 0.70–1.30)
Globulin: 3 g/dL (calc) (ref 1.9–3.7)
Glucose, Bld: 179 mg/dL — ABNORMAL HIGH (ref 65–99)
Potassium: 3.8 mmol/L (ref 3.5–5.3)
Sodium: 143 mmol/L (ref 135–146)
Total Bilirubin: 0.5 mg/dL (ref 0.2–1.2)
Total Protein: 7.3 g/dL (ref 6.1–8.1)
eGFR: 63 mL/min/{1.73_m2} (ref >=60)

## 2022-10-31 LAB — HIV-1 RNA QUANT-NO REFLEX-BLD
HIV 1 RNA Quant: 45 Copies/mL — ABNORMAL HIGH
HIV-1 RNA Quant, Log: 1.65 Log cps/mL — ABNORMAL HIGH

## 2022-10-31 LAB — CBC
HCT: 41.8 % (ref 38.5–50.0)
Hemoglobin: 14.1 g/dL (ref 13.2–17.1)
MCH: 32.7 pg (ref 27.0–33.0)
MCHC: 33.7 g/dL (ref 32.0–36.0)
MCV: 97 fL (ref 80.0–100.0)
MPV: 11 fL (ref 7.5–12.5)
Platelets: 202 10*3/uL (ref 140–400)
RBC: 4.31 10*6/uL (ref 4.20–5.80)
RDW: 11.8 % (ref 11.0–15.0)
WBC: 5.8 10*3/uL (ref 3.8–10.8)

## 2022-12-31 ENCOUNTER — Encounter: Payer: Self-pay | Admitting: Pharmacist

## 2023-03-24 ENCOUNTER — Telehealth: Payer: Self-pay

## 2023-03-24 NOTE — Telephone Encounter (Signed)
PA initiated for Trulance for the patient through covermymeds. Awaiting for approval.  Per Dr. Earlene Plater ok to continue refilling.  Kadeidra Coryell T Pricilla Loveless

## 2023-04-01 NOTE — Telephone Encounter (Signed)
PA for Trulance approved for one year. Approval faxed to pharmacy.   Sandie Ano, RN

## 2023-04-28 ENCOUNTER — Ambulatory Visit: Payer: PRIVATE HEALTH INSURANCE | Admitting: Internal Medicine

## 2023-05-07 NOTE — Progress Notes (Signed)
The 10-year ASCVD risk score (Arnett DK, et al., 2019) is: 11.9%   Values used to calculate the score:     Age: 52 years     Sex: Male     Is Non-Hispanic African American: Yes     Diabetic: No     Tobacco smoker: Yes     Systolic Blood Pressure: 142 mmHg     Is BP treated: No     HDL Cholesterol: 42 mg/dL     Total Cholesterol: 173 mg/dL  Sandie Ano, RN

## 2023-05-22 ENCOUNTER — Other Ambulatory Visit: Payer: Self-pay

## 2023-05-22 ENCOUNTER — Other Ambulatory Visit: Payer: PRIVATE HEALTH INSURANCE

## 2023-05-22 DIAGNOSIS — Z113 Encounter for screening for infections with a predominantly sexual mode of transmission: Secondary | ICD-10-CM

## 2023-05-22 DIAGNOSIS — B20 Human immunodeficiency virus [HIV] disease: Secondary | ICD-10-CM

## 2023-05-22 DIAGNOSIS — Z79899 Other long term (current) drug therapy: Secondary | ICD-10-CM

## 2023-05-22 LAB — CBC WITH DIFFERENTIAL/PLATELET
Absolute Monocytes: 510 cells/uL (ref 200–950)
Basophils Absolute: 29 cells/uL (ref 0–200)
HCT: 44.5 % (ref 38.5–50.0)
Lymphs Abs: 1821 cells/uL (ref 850–3900)
MCH: 33.2 pg — ABNORMAL HIGH (ref 27.0–33.0)
MCV: 97.8 fL (ref 80.0–100.0)
MPV: 11.4 fL (ref 7.5–12.5)

## 2023-05-23 LAB — T-HELPER CELL (CD4) - (RCID CLINIC ONLY)
CD4 % Helper T Cell: 34 % (ref 33–65)
CD4 T Cell Abs: 541 /uL (ref 400–1790)

## 2023-05-23 LAB — COMPLETE METABOLIC PANEL WITH GFR
AG Ratio: 1.6 (calc) (ref 1.0–2.5)
ALT: 37 U/L (ref 9–46)
Albumin: 4.3 g/dL (ref 3.6–5.1)
BUN/Creatinine Ratio: 10 (calc) (ref 6–22)
BUN: 14 mg/dL (ref 7–25)
Creat: 1.35 mg/dL — ABNORMAL HIGH (ref 0.70–1.30)
Glucose, Bld: 163 mg/dL — ABNORMAL HIGH (ref 65–99)
Total Bilirubin: 0.7 mg/dL (ref 0.2–1.2)
Total Protein: 7 g/dL (ref 6.1–8.1)

## 2023-05-23 LAB — LIPID PANEL: LDL Cholesterol (Calc): 108 mg/dL (calc) — ABNORMAL HIGH

## 2023-05-24 LAB — CBC WITH DIFFERENTIAL/PLATELET
Basophils Relative: 0.5 %
Eosinophils Absolute: 238 cells/uL (ref 15–500)
Eosinophils Relative: 4.1 %
Hemoglobin: 15.1 g/dL (ref 13.2–17.1)
MCHC: 33.9 g/dL (ref 32.0–36.0)
Monocytes Relative: 8.8 %
Neutro Abs: 3202 cells/uL (ref 1500–7800)
Neutrophils Relative %: 55.2 %
Platelets: 203 10*3/uL (ref 140–400)
RBC: 4.55 10*6/uL (ref 4.20–5.80)
RDW: 11.7 % (ref 11.0–15.0)
Total Lymphocyte: 31.4 %
WBC: 5.8 10*3/uL (ref 3.8–10.8)

## 2023-05-24 LAB — LIPID PANEL
Cholesterol: 186 mg/dL (ref <200)
HDL: 44 mg/dL (ref >=40)
Non-HDL Cholesterol (Calc): 142 mg/dL (calc) — ABNORMAL HIGH (ref <130)
Total CHOL/HDL Ratio: 4.2 (calc) (ref <5.0)
Triglycerides: 218 mg/dL — ABNORMAL HIGH (ref <150)

## 2023-05-24 LAB — COMPLETE METABOLIC PANEL WITH GFR
AST: 23 U/L (ref 10–35)
Alkaline phosphatase (APISO): 79 U/L (ref 35–144)
CO2: 27 mmol/L (ref 20–32)
Calcium: 9.5 mg/dL (ref 8.6–10.3)
Chloride: 105 mmol/L (ref 98–110)
Globulin: 2.7 g/dL (calc) (ref 1.9–3.7)
Potassium: 3.5 mmol/L (ref 3.5–5.3)
Sodium: 141 mmol/L (ref 135–146)
eGFR: 63 mL/min/{1.73_m2} (ref >=60)

## 2023-05-24 LAB — HIV-1 RNA QUANT-NO REFLEX-BLD
HIV 1 RNA Quant: 23 Copies/mL — ABNORMAL HIGH
HIV-1 RNA Quant, Log: 1.37 Log cps/mL — ABNORMAL HIGH

## 2023-05-24 LAB — RPR: RPR Ser Ql: NONREACTIVE

## 2023-05-26 ENCOUNTER — Other Ambulatory Visit: Payer: PRIVATE HEALTH INSURANCE

## 2023-06-09 ENCOUNTER — Other Ambulatory Visit: Payer: Self-pay

## 2023-06-09 ENCOUNTER — Ambulatory Visit (INDEPENDENT_AMBULATORY_CARE_PROVIDER_SITE_OTHER): Payer: PRIVATE HEALTH INSURANCE | Admitting: Family

## 2023-06-09 ENCOUNTER — Encounter: Payer: Self-pay | Admitting: Family

## 2023-06-09 VITALS — BP 132/87 | HR 79 | Temp 98.2°F | Ht 66.0 in | Wt 193.0 lb

## 2023-06-09 DIAGNOSIS — F17200 Nicotine dependence, unspecified, uncomplicated: Secondary | ICD-10-CM

## 2023-06-09 DIAGNOSIS — B20 Human immunodeficiency virus [HIV] disease: Secondary | ICD-10-CM | POA: Diagnosis not present

## 2023-06-09 DIAGNOSIS — Z9189 Other specified personal risk factors, not elsewhere classified: Secondary | ICD-10-CM | POA: Diagnosis not present

## 2023-06-09 DIAGNOSIS — Z79899 Other long term (current) drug therapy: Secondary | ICD-10-CM

## 2023-06-09 DIAGNOSIS — F17210 Nicotine dependence, cigarettes, uncomplicated: Secondary | ICD-10-CM

## 2023-06-09 DIAGNOSIS — Z0000 Encounter for general adult medical examination without abnormal findings: Secondary | ICD-10-CM

## 2023-06-09 MED ORDER — SYMTUZA 800-150-200-10 MG PO TABS: 1.0000 | ORAL_TABLET | Freq: Every day | ORAL | 7 refills | Status: DC

## 2023-06-09 NOTE — Patient Instructions (Addendum)
Nice to see you.  Continue to take your medication daily as prescribed.  Refills have been sent to the pharmacy.  Please call [facility]) to schedule/follow up on your dental care at [phone]  [facility] [address] [place], [place], [place] [address] Phone: [phone]  [facility]  [address] [place], [place], [place] [address] Phone: [phone]  Plan for follow up in 6 months or sooner if needed with lab work 1-2 weeks prior to appointment.   Have a great day and stay safe!

## 2023-06-09 NOTE — Assessment & Plan Note (Signed)
Elijah Guerra is at increased risk of cardiovascular disease with ASCVD of 10%.  Discussed implications and recommendations for starting a statin medication which would help reduce cholesterol as well as inflammation associated with HIV.  Encouraged tobacco cessation.  Not interested in starting medication at this time.

## 2023-06-09 NOTE — Assessment & Plan Note (Signed)
Mr. Armenti continues to have well-controlled virus with good adherence and tolerance to Symtuza.  Reviewed lab work and discussed plan of care and U equals U.  Continue current dose of Symtuza.  Plan for follow-up in 6 months or sooner if needed with lab work 1 to 2 weeks prior to appointment.

## 2023-06-09 NOTE — Progress Notes (Signed)
Brief Narrative   Patient ID: Elijah Guerra, male    DOB: 05-24-1971, 52 y.o.   MRN: [id number]  Elijah Guerra is a 52 y/o AA male diagnosed with HIV disease in 2007 with risk factor of MSM. Initial viral load was 21,811 and CD4 count 192. Entered care at [facility] Stage 3. Genotype with K101E (I - Rilpivirine and Neviripine). No history of opportunistic infection. ART experienced with Atripla, Genvoya, Cabenuva (virologic failure), and Symtuza.   Subjective:    Chief Complaint  Patient presents with   Follow-up   HIV Positive/AIDS    HPI:  Elijah Guerra is a 52 y.o. male with HIV disease last seen by Elijah Guerra on 10/29/22 with well controlled virus and good adherence and tolerance to Symtuza. Viral load was undetectable and CD4 count 534. Most recent lab work completed on 05/22/23 with viral load undetectable and CD4 count 541. Kidney function, liver function and electrolytes within normal ranges. Lipid profile with LDL 108, HDL 44 and triglycerides 218. ASCVD risk is 10.6%  Elijah Guerra has been doing.  He did experience a fall after colliding with his dog and has left shoulder pain.  Continues to take Symtuza as prescribed with no adverse side effects or problems obtaining medication.  Recently went on vacation and currently working the rest of the summer.  Condoms and STD testing offered. Due for routine dental care. Reviewed vaccinations.  Denies fevers, chills, night sweats, headaches, changes in vision, neck pain/stiffness, nausea, diarrhea, vomiting, lesions or rashes.   No Known Allergies    Outpatient Medications Prior to Visit  Medication Sig Dispense Refill   hydrocortisone (ANUSOL-HC) 2.5 % rectal cream Place 1 Application rectally 2 (two) times daily. 30 g 0   Plecanatide (TRULANCE) 3 MG TABS TAKE 1 TABLET BY MOUTH DAILY FOR 21 DAYS AS DIRECTED 30 tablet 6   Darunavir-Cobicistat-Emtricitabine-Tenofovir Alafenamide (SYMTUZA) 800-150-200-10 MG TABS Take 1 tablet by mouth daily with  breakfast. 30 tablet 11   No facility-administered medications prior to visit.     Past Medical History:  Diagnosis Date   GERD (gastroesophageal reflux disease)    HIV (human immunodeficiency virus infection) (HCC)      Past Surgical History:  Procedure Laterality Date   HERNIA REPAIR     childhood      Review of Systems  Constitutional:  Negative for appetite change, chills, fatigue, fever and unexpected weight change.  Eyes:  Negative for visual disturbance.  Respiratory:  Negative for cough, chest tightness, shortness of breath and wheezing.   Cardiovascular:  Negative for chest pain and leg swelling.  Gastrointestinal:  Negative for abdominal pain, constipation, diarrhea, nausea and vomiting.  Genitourinary:  Negative for dysuria, flank pain, frequency, genital sores, hematuria and urgency.  Musculoskeletal:        Left shoulder pain  Skin:  Negative for rash.  Allergic/Immunologic: Negative for immunocompromised state.  Neurological:  Negative for dizziness and headaches.      Objective:    BP 132/87   Pulse 79   Temp 98.2 F (36.8 C) (Temporal)   Ht 5\' 6"  (1.676 m)   Wt 193 lb (87.5 kg)   SpO2 97%   BMI 31.15 kg/m  Nursing note and vital signs reviewed.  Physical Exam Constitutional:      General: He is not in acute distress.    Appearance: He is well-developed.  Eyes:     Conjunctiva/sclera: Conjunctivae normal.  Cardiovascular:     Rate and Rhythm: Normal rate and  regular rhythm.     Heart sounds: Normal heart sounds. No murmur heard.    No friction rub. No gallop.  Pulmonary:     Effort: Pulmonary effort is normal. No respiratory distress.     Breath sounds: Normal breath sounds. No wheezing or rales.  Chest:     Chest wall: No tenderness.  Abdominal:     General: Bowel sounds are normal.     Palpations: Abdomen is soft.     Tenderness: There is no abdominal tenderness.  Musculoskeletal:     Cervical back: Neck supple.  Lymphadenopathy:      Cervical: No cervical adenopathy.  Skin:    General: Skin is warm and dry.     Findings: No rash.  Neurological:     Mental Status: He is alert and oriented to person, place, and time.  Psychiatric:        Behavior: Behavior normal.        Thought Content: Thought content normal.        Judgment: Judgment normal.         06/09/2023    3:14 PM 05/02/2022    1:59 PM 08/29/2020    4:31 PM 08/03/2020    4:21 PM 01/25/2020    3:50 PM  Depression screen PHQ 2/9  Decreased Interest 0 0 0 0 0  Down, Depressed, Hopeless 0 0 0 0 0  PHQ - 2 Score 0 0 0 0 0       Assessment & Plan:    Patient Active Problem List   Diagnosis Date Noted   At increased risk for cardiovascular disease 06/09/2023   Nocturia more than twice per night 01/09/2021   Healthcare maintenance 11/06/2018   Hepatitis B immune 01/22/2017   Skin nodule 09/04/2015   Contact dermatitis 09/15/2014   Hyperglycemia 08/30/2013   Erectile dysfunction 08/30/2013   Arthritis 02/12/2012   GERD 12/29/2009   Constipation 12/29/2009   VOMITING 12/29/2009   PROTEINURIA, MILD 04/01/2007   Human immunodeficiency virus (HIV) disease (HCC) 01/01/2007   DISORDER, TOBACCO USE 01/01/2007     Problem List Items Addressed This Visit       Other   Human immunodeficiency virus (HIV) disease (HCC) - Primary    Elijah Guerra continues to have well-controlled virus with good adherence and tolerance to Symtuza.  Reviewed lab work and discussed plan of care and U equals U.  Continue current dose of Symtuza.  Plan for follow-up in 6 months or sooner if needed with lab work 1 to 2 weeks prior to appointment.      Relevant Medications   Darunavir-Cobicistat-Emtricitabine-Tenofovir Alafenamide (SYMTUZA) 800-150-200-10 MG TABS   Other Relevant Orders   COMPLETE METABOLIC PANEL WITH GFR   HIV-1 RNA quant-no reflex-bld   T-helper cell (CD4)- ([facility] clinic only)   DISORDER, TOBACCO USE    Elijah Guerra continues to smoke tobacco.  Discussed  importance of tobacco cessation to reduce risk of respiratory, malignant, renal, and cardiovascular disease in the future.  Not ready to quit at this time.      Healthcare maintenance    Discussed importance of safe sexual practice and condom use. Condoms and STD testing offered.  Due for routine dental care with appointment information provided in AVS.  Colon cancer screening up to date.       At increased risk for cardiovascular disease    Mr. Coba is at increased risk of cardiovascular disease with ASCVD of 10%.  Discussed implications and recommendations for starting a  statin medication which would help reduce cholesterol as well as inflammation associated with HIV.  Encouraged tobacco cessation.  Not interested in starting medication at this time.      Relevant Orders   Lipid panel   Other Visit Diagnoses     Pharmacologic therapy       Relevant Orders   Lipid panel        I am having Mardella Layman maintain his Trulance, hydrocortisone, and Symtuza.   Meds ordered this encounter  Medications   Darunavir-Cobicistat-Emtricitabine-Tenofovir Alafenamide (SYMTUZA) 800-150-200-10 MG TABS    Sig: Take 1 tablet by mouth daily with breakfast.    Dispense:  30 tablet    Refill:  7    Order Specific Question:   Supervising Provider    Answer:   Judyann Munson [[id number]]     Follow-up: Return in about 6 months (around 12/10/2023), or if symptoms worsen or fail to improve.   Marcos Eke, MSN, FNP-C Nurse Practitioner [facility] for Infectious Disease [facility] Main number: [phone]

## 2023-06-09 NOTE — Assessment & Plan Note (Signed)
Discussed importance of safe sexual practice and condom use. Condoms and STD testing offered.  Due for routine dental care with appointment information provided in AVS.  Colon cancer screening up to date.

## 2023-06-09 NOTE — Assessment & Plan Note (Signed)
Mr. Foister continues to smoke tobacco.  Discussed importance of tobacco cessation to reduce risk of respiratory, malignant, renal, and cardiovascular disease in the future.  Not ready to quit at this time.

## 2023-07-15 ENCOUNTER — Ambulatory Visit (INDEPENDENT_AMBULATORY_CARE_PROVIDER_SITE_OTHER): Payer: PRIVATE HEALTH INSURANCE | Admitting: Family Medicine

## 2023-07-15 ENCOUNTER — Other Ambulatory Visit: Payer: Self-pay

## 2023-07-15 ENCOUNTER — Encounter: Payer: Self-pay | Admitting: Family Medicine

## 2023-07-15 VITALS — BP 148/96 | Ht 65.0 in | Wt 185.0 lb

## 2023-07-15 DIAGNOSIS — M25512 Pain in left shoulder: Secondary | ICD-10-CM

## 2023-07-15 DIAGNOSIS — M25511 Pain in right shoulder: Secondary | ICD-10-CM | POA: Diagnosis not present

## 2023-07-15 MED ORDER — MELOXICAM 15 MG PO TABS: 15.0000 mg | ORAL_TABLET | Freq: Every day | ORAL | 2 refills | Status: DC

## 2023-07-15 NOTE — Progress Notes (Incomplete)
PCP: Veryl Speak, FNP  Subjective:   HPI: Patient is a 52 y.o. male here for Bilateral shoulder pain with the left being worse than the right.  Patient states that approximately a month ago he was hit in the back by his dog and fell on his left elbow.  Patient states that since then he has been dealing with some left shoulder pain that is relatively debilitating.  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  Patient also notes that he fell about a week ago on his right shoulder down the stairs but notes that the right shoulder is not nearly as bad as the left.  Patient states that he cannot lift his left arm above 80-ish degrees.  Patient states he also notes some crepitus whenever he is moving his arms around.  No other concerns at this time.  Past Medical History:  Diagnosis Date   GERD (gastroesophageal reflux disease)    HIV (human immunodeficiency virus infection) (HCC)     Current Outpatient Medications on File Prior to Visit  Medication Sig Dispense Refill   Darunavir-Cobicistat-Emtricitabine-Tenofovir Alafenamide (SYMTUZA) 800-150-200-10 MG TABS Take 1 tablet by mouth daily with breakfast. 30 tablet 7   hydrocortisone (ANUSOL-HC) 2.5 % rectal cream Place 1 Application rectally 2 (two) times daily. 30 g 0   Plecanatide (TRULANCE) 3 MG TABS TAKE 1 TABLET BY MOUTH DAILY FOR 21 DAYS AS DIRECTED 30 tablet 6   [DISCONTINUED] omeprazole (PRILOSEC OTC) 20 MG tablet Take 20 mg by mouth daily.     No current facility-administered medications on file prior to visit.    Past Surgical History:  Procedure Laterality Date   HERNIA REPAIR     childhood    No Known Allergies  BP (!) 148/96   Ht 5\' 5"  (1.651 m)   Wt 185 lb (83.9 kg)   BMI 30.79 kg/m       No data to display              No data to display              Objective:  Physical Exam:  Gen: NAD, comfortable in exam  room  Shoulder, bilateral: No  skin changes, erythema, or ecchymosis noted. No evidence of bony deformity, asymmetry, or muscle atrophy; No tenderness over long head of biceps (bicipital groove). No TTP at [facility] joint. Patient has decreased range of motion with the left shoulder with abduction as well as pain against internal rotation and abduction.  Patient's range of motion with abduction is approximately 80 degrees.Thumb to T12 with tenderness. Strength 4/5 throughout on abduction of the left shoulder. No abnormal scapular function observed. Sensation to light touch intact.   Special Tests:   - Painful Arc present at 60 to 80 degrees   - Empty can: POS   - Int/Ext Rotation test: INT pos/ EX negative   - Gerber Lift-Off Test: POS   - Crossarm Adduction test: NEG   - Hawkins: NEG   - Neer test: POS   - O'brien's test: NEG   - Speeds test: NEG   U/S Findings: Bicipital groove: Biceps tendon appears to be intact without any echogenicity or tears.  There is some notable fluid in the bicipital groove.   Supraspinatus Tendon:The supraspinatus tendon is well-defined and appears normal. The tendon is of normal thickness without any hypoechoic areas or tears. The bursal surface  shows hypoechoic changes consistent with fluid accumulation.  Infraspinatus Tendon:The infraspinatus tendon is  intact and demonstrates normal echogenicity. No thickening, irregularity, or signs of tendinopathy are observed. The tendon attachments to the greater tuberosity are normal.  Teres Minor Tendon:The teres minor tendon is normal in appearance with no abnormalities. The fibers are well-aligned and demonstrate normal echogenicity.  Subscapularis Tendon:The subscapularis tendon is intact without any tears or thickening. There are some hypoechoic changes consistent with bursitis of the tendon  Subacromial-Subdeltoid Bursa: There is notable fluid in the sub acromial bursa. The acromioclavicular joint shows some mild  effusion  Impression:Normal Rotator Cuff Muscles and Tendons: No evidence of tears, tendinopathy, or other abnormalities. Normal Subacromial-Subdeltoid Bursa: No signs of inflammation or bursitis.  Assessment & Plan:  1. 1. Acute pain of both shoulders - Given patient's physical exam as well as ultrasound findings, patient likely dealing with bursitis multiple tendons in the shoulder.  Patient would benefit from anti-inflammatory medication as well as physical therapy at this time.  Will do Mobic daily for the next 7 to 10 days as well as icing daily. Patient will also attend physical therapy and then follow-up in the clinic approximately 4 to 5 weeks from now.  At that time will rescan shoulder and can consider steroid injection at that time if minimal improvement from conservative therapies. - Ambulatory referral to Physical Therapy  2. Pain in joint of left shoulder - [place] LIMITED JOINT SPACE STRUCTURES UP LEFT; Future    Brenton Grills MD, PGY-4  Sports Medicine Fellow [facility]

## 2023-07-16 ENCOUNTER — Encounter: Payer: Self-pay | Admitting: Family Medicine

## 2023-08-04 NOTE — Therapy (Unsigned)
OUTPATIENT PHYSICAL THERAPY SHOULDER EVALUATION   Patient Name: Elijah Guerra MRN: [id number] DOB:[birth year], 52 y.o., male Today's Date: 08/04/2023  END OF SESSION:   Past Medical History:  Diagnosis Date   GERD (gastroesophageal reflux disease)    HIV (human immunodeficiency virus infection) (HCC)    Past Surgical History:  Procedure Laterality Date   HERNIA REPAIR     childhood   Patient Active Problem List   Diagnosis Date Noted   At increased risk for cardiovascular disease 06/09/2023   Nocturia more than twice per night 01/09/2021   Healthcare maintenance 11/06/2018   Hepatitis B immune 01/22/2017   Skin nodule 09/04/2015   Contact dermatitis 09/15/2014   Hyperglycemia 08/30/2013   Erectile dysfunction 08/30/2013   Arthritis 02/12/2012   GERD 12/29/2009   Constipation 12/29/2009   VOMITING 12/29/2009   PROTEINURIA, MILD 04/01/2007   Human immunodeficiency virus (HIV) disease (HCC) 01/01/2007   DISORDER, TOBACCO USE 01/01/2007    PCP: Veryl Speak, FNP   REFERRING PROVIDER: Lenda Kelp, MD  REFERRING DIAG: M25.511,M25.512 (ICD-10-CM) - Acute pain of both shoulders  THERAPY DIAG:  No diagnosis found.  Rationale for Evaluation and Treatment: Rehabilitation  ONSET DATE: 1+ month  SUBJECTIVE:                                                                                                                                                                                      SUBJECTIVE STATEMENT: *** Hand dominance: {MISC; OT HAND DOMINANCE:[id number]}  PERTINENT HISTORY: HPI: Patient is a 52 y.o. male here for Bilateral shoulder pain with the left being worse than the right.  Patient states that approximately a month ago he was hit in the back by his dog and fell on his left elbow.  Patient states that since then he has been dealing with some left shoulder pain that is relatively debilitating.  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  Patient also notes that he fell about a week ago on his right shoulder down the stairs but notes that the right shoulder is not nearly as bad as the left.  Patient states that he cannot lift his left arm above 80-ish degrees.  Patient states he also notes some crepitus whenever he is moving his arms around.  No other concerns at this time.  PAIN:  Are you having pain? {OPRCPAIN:27236}  PRECAUTIONS: None  RED FLAGS: None   WEIGHT BEARING RESTRICTIONS: No  FALLS:  Has patient fallen in last 6 months? Yes. Number of falls 1  OCCUPATION: Truck driver  PLOF: Independent  PATIENT GOALS:***  NEXT MD VISIT:   OBJECTIVE:   DIAGNOSTIC FINDINGS:  Impression:Normal Rotator Cuff Muscles and Tendons: No evidence of tears, tendinopathy, or other abnormalities. Subacromial bursitis, bicipital tenosynovitis.  PATIENT SURVEYS:  FOTO ***  POSTURE: ***  UPPER EXTREMITY ROM:   {AROM/PROM:27142} ROM Right eval Left eval  Shoulder flexion    Shoulder extension    Shoulder abduction    Shoulder adduction    Shoulder internal rotation    Shoulder external rotation    Elbow flexion    Elbow extension    Wrist flexion    Wrist extension    Wrist ulnar deviation    Wrist radial deviation    Wrist pronation    Wrist supination    (Blank rows = not tested)  UPPER EXTREMITY MMT:  MMT Right eval Left eval  Shoulder flexion    Shoulder extension    Shoulder abduction    Shoulder adduction    Shoulder internal rotation    Shoulder external rotation    Middle trapezius    Lower trapezius    Elbow flexion    Elbow extension    Wrist flexion    Wrist extension    Wrist ulnar deviation    Wrist radial deviation    Wrist pronation    Wrist supination    Grip strength (lbs)    (Blank rows = not tested)  SHOULDER SPECIAL TESTS: Impingement tests: {shoulder impingement test:25231:a} SLAP  lesions: {SLAP lesions:25232} Instability tests: {shoulder instability test:25233} Rotator cuff assessment: {rotator cuff assessment:25234} Biceps assessment: {biceps assessment:25235}  JOINT MOBILITY TESTING:  ***  PALPATION:  ***   TODAY'S TREATMENT:                                                                                                                                         DATE: ***   PATIENT EDUCATION: Education details: Discussed eval findings, rehab rationale and POC and patient is in agreement  Person educated: Patient Education method: Explanation Education comprehension: verbalized understanding and needs further education  HOME EXERCISE PROGRAM: ***  ASSESSMENT:  CLINICAL IMPRESSION: Patient is a *** y.o. *** who was seen today for physical therapy evaluation and treatment for ***.   OBJECTIVE IMPAIRMENTS: {opptimpairments:25111}.   ACTIVITY LIMITATIONS: {activitylimitations:27494}  PARTICIPATION LIMITATIONS: {participationrestrictions:25113}  PERSONAL FACTORS: {Personal factors:25162} are also affecting patient's functional outcome.   REHAB POTENTIAL: Good  CLINICAL DECISION MAKING: Evolving/moderate complexity  EVALUATION COMPLEXITY: Moderate   GOALS: Goals reviewed with patient? {yes/no:20286}  SHORT TERM GOALS: Target date: ***  *** Baseline: Goal status: INITIAL  2.  *** Baseline:  Goal status: INITIAL  3.  *** Baseline:  Goal status: INITIAL  4.  *** Baseline:  Goal status: INITIAL  5.  *** Baseline:  Goal status: INITIAL  6.  *** Baseline:  Goal status: INITIAL  LONG TERM GOALS: Target date: ***  *** Baseline:  Goal status: INITIAL  2.  *** Baseline:  Goal status: INITIAL  3.  *** Baseline:  Goal status: INITIAL  4.  *** Baseline:  Goal status: INITIAL  5.  *** Baseline:  Goal status: INITIAL  6.  *** Baseline:  Goal status: INITIAL  PLAN:  PT FREQUENCY: 1-2x/week  PT DURATION: 6  weeks  PLANNED INTERVENTIONS: Therapeutic exercises, Therapeutic activity, Neuromuscular re-education, Balance training, Gait training, Patient/Family education, Self Care, Joint mobilization, Dry Needling, Electrical stimulation, Cryotherapy, Moist heat, Manual therapy, and Re-evaluation  PLAN FOR NEXT SESSION: HEP review and update, manual techniques as appropriate, aerobic tasks, ROM and flexibility activities, strengthening and PREs, TPDN, gait and balance training as needed     Hildred Laser, PT 08/04/2023, 1:40 PM

## 2023-08-05 ENCOUNTER — Other Ambulatory Visit: Payer: Self-pay

## 2023-08-05 ENCOUNTER — Ambulatory Visit: Payer: 59 | Attending: Family Medicine

## 2023-08-05 DIAGNOSIS — M25512 Pain in left shoulder: Secondary | ICD-10-CM | POA: Diagnosis present

## 2023-08-05 DIAGNOSIS — M12811 Other specific arthropathies, not elsewhere classified, right shoulder: Secondary | ICD-10-CM | POA: Diagnosis present

## 2023-08-05 DIAGNOSIS — M12812 Other specific arthropathies, not elsewhere classified, left shoulder: Secondary | ICD-10-CM | POA: Insufficient documentation

## 2023-08-05 DIAGNOSIS — M25511 Pain in right shoulder: Secondary | ICD-10-CM | POA: Insufficient documentation

## 2023-08-05 DIAGNOSIS — M6281 Muscle weakness (generalized): Secondary | ICD-10-CM | POA: Diagnosis present

## 2023-08-12 ENCOUNTER — Ambulatory Visit: Payer: 59

## 2023-08-12 DIAGNOSIS — M25511 Pain in right shoulder: Secondary | ICD-10-CM

## 2023-08-12 DIAGNOSIS — M6281 Muscle weakness (generalized): Secondary | ICD-10-CM

## 2023-08-12 NOTE — Therapy (Signed)
OUTPATIENT PHYSICAL THERAPY TREATMENT  NOTE   Patient Name: Alexandra Mosqueda MRN: [id number] DOB:[birth year], 52 y.o., male Today's Date: 08/12/2023  END OF SESSION:  PT End of Session - 08/12/23 1603     Visit Number 2    Number of Visits 5    Date for PT Re-Evaluation 10/05/23    Authorization Type [employer]    PT Start Time 1613    PT Stop Time 1653    PT Time Calculation (min) 40 min    Activity Tolerance Patient tolerated treatment well    Behavior During Therapy [facility] for tasks assessed/performed              Past Medical History:  Diagnosis Date   GERD (gastroesophageal reflux disease)    HIV (human immunodeficiency virus infection) (HCC)    Past Surgical History:  Procedure Laterality Date   HERNIA REPAIR     childhood   Patient Active Problem List   Diagnosis Date Noted   At increased risk for cardiovascular disease 06/09/2023   Nocturia more than twice per night 01/09/2021   Healthcare maintenance 11/06/2018   Hepatitis B immune 01/22/2017   Skin nodule 09/04/2015   Contact dermatitis 09/15/2014   Hyperglycemia 08/30/2013   Erectile dysfunction 08/30/2013   Arthritis 02/12/2012   GERD 12/29/2009   Constipation 12/29/2009   VOMITING 12/29/2009   PROTEINURIA, MILD 04/01/2007   Human immunodeficiency virus (HIV) disease (HCC) 01/01/2007   DISORDER, TOBACCO USE 01/01/2007    PCP: Veryl Speak, FNP   REFERRING PROVIDER: Lenda Kelp, MD  REFERRING DIAG: M25.511,M25.512 (ICD-10-CM) - Acute pain of both shoulders  THERAPY DIAG:  Acute pain of both shoulders  Muscle weakness (generalized)  Rationale for Evaluation and Treatment: Rehabilitation  ONSET DATE: 1+ month  SUBJECTIVE:                                                                                                                                                                                      SUBJECTIVE STATEMENT: Patient reports HEP compliance, states his pain is worse in  the morning.   Hand dominance: Right  PERTINENT HISTORY: HPI: Patient is a 52 y.o. male here for Bilateral shoulder pain with the left being worse than the right.  Patient states that approximately a month ago he was hit in the back by his dog and fell on his left elbow.  Patient states that since then he has been dealing with some left shoulder pain that is relatively debilitating.  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  Patient also notes that he fell about a week ago on his right shoulder down the stairs but notes that the right shoulder is not nearly as bad as the left.  Patient states that he cannot lift his left arm above 80-ish degrees.  Patient states he also notes some crepitus whenever he is moving his arms around.  No other concerns at this time.  PAIN:  Are you having pain? Yes: NPRS scale: 4/10 Pain location: B shoulder Pain description: ache Aggravating factors: UE use Relieving factors: rest  PRECAUTIONS: None  RED FLAGS: None   WEIGHT BEARING RESTRICTIONS: No  FALLS:  Has patient fallen in last 6 months? Yes. Number of falls 1  OCCUPATION: Truck driver  PLOF: Independent  PATIENT GOALS:To regain the use of my shoulders  NEXT MD VISIT:   OBJECTIVE:   DIAGNOSTIC FINDINGS:  Impression:Normal Rotator Cuff Muscles and Tendons: No evidence of tears, tendinopathy, or other abnormalities. Subacromial bursitis, bicipital tenosynovitis.  PATIENT SURVEYS:  FOTO 52(72 predicted)  POSTURE: unremarkable  UPPER EXTREMITY ROM: WNL B  Active ROM Right eval Left eval  Shoulder flexion    Shoulder extension    Shoulder abduction    Shoulder adduction    Shoulder internal rotation    Shoulder external rotation    Elbow flexion    Elbow extension    Wrist flexion    Wrist extension    Wrist ulnar deviation    Wrist radial deviation    Wrist pronation    Wrist  supination    (Blank rows = not tested)  UPPER EXTREMITY MMT:  MMT Right eval Left eval  Shoulder flexion 5- 5-  Shoulder extension 5- 5-  Shoulder abduction 5- 5-  Shoulder adduction    Shoulder internal rotation 5- 5-  Shoulder external rotation 5- 5-  Middle trapezius 5- 5-  Lower trapezius 5- 5-  Elbow flexion    Elbow extension    Wrist flexion    Wrist extension    Wrist ulnar deviation    Wrist radial deviation    Wrist pronation    Wrist supination    Grip strength (lbs)    (Blank rows = not tested)  SHOULDER SPECIAL TESTS: Impingement tests: Neer impingement test: negative and Hawkins/Kennedy impingement test: negative Rotator cuff assessment: Drop arm test: negative, Empty can test: negative, and Full can test: negative   PALPATION:  TTP B AC joints   TODAY'S TREATMENT:         [facility] Adult PT Treatment:                                                DATE: 08/12/23 Therapeutic Exercise: UBE level 2 3'/3' fwd/bwd  High/low rows 45# 2x10 Lat pull down 45# 2x10 Shoulder extension 2x10# cables 2x10 Horizontal abduction back against wall GTB 2x10 Diagonals back against wall GTB 2x10 BIL Seated BIL ER with scap retraction GTB 2x10 Supine serratus punch 2# 2x10 BIL Seated upper trap stretch 2x30" BIL Seated levator scap stretch 2x30" BIL  DATE: 08/05/23 Eval and HEP   PATIENT EDUCATION: Education details: Discussed eval findings, rehab rationale and POC and patient is in agreement  Person educated: Patient Education method: Explanation Education comprehension: verbalized understanding and needs further education  HOME EXERCISE PROGRAM: Access Code: [email or web address] URL: [email or web address] Date: 08/05/2023 Prepared by: Gustavus Bryant  Exercises - Shoulder External Rotation and Scapular Retraction with Resistance  - 2  x daily - 5 x weekly - 2 sets - 15 reps - Standing Shoulder Horizontal Abduction with Resistance  - 2 x daily - 5 x weekly - 2 sets - 15 reps - Standing Shoulder Scaption  - 2 x daily - 5 x weekly - 2 sets - 15 reps  ASSESSMENT:  CLINICAL IMPRESSION: Patient presents to PT reporting continued BIL shoulder pain, L>R and that he has been compliant with his HEP. Session today focused on periscapular and RTC strengthening. Patient was able to tolerate all prescribed exercises with no adverse effects. Patient continues to benefit from skilled PT services and should be progressed as able to improve functional independence.    OBJECTIVE IMPAIRMENTS: decreased activity tolerance, decreased knowledge of condition, decreased ROM, decreased strength, impaired UE functional use, postural dysfunction, and pain.   ACTIVITY LIMITATIONS: carrying, lifting, and reach over head  PARTICIPATION LIMITATIONS:  washing face  PERSONAL FACTORS: Age, Fitness, and Time since onset of injury/illness/exacerbation are also affecting patient's functional outcome.   REHAB POTENTIAL: Good  CLINICAL DECISION MAKING: Evolving/moderate complexity  EVALUATION COMPLEXITY: Moderate   GOALS: Goals reviewed with patient? No  SHORT TERM GOALS=LONG TERM GOALS: Target date: 09/02/2023   Patient to demonstrate independence in HEP  Baseline: Access Code: [email or web address] Goal status: INITIAL  2.  Patient will score at least 72% on FOTO to signify clinically meaningful improvement in functional abilities.   Baseline: 52% Goal status: INITIAL   3.  Increase B shoulder strength to 5/5 Baseline:  MMT Right eval Left eval  Shoulder flexion 5- 5-  Shoulder extension 5- 5-  Shoulder abduction 5- 5-  Shoulder adduction    Shoulder internal rotation 5- 5-  Shoulder external rotation 5- 5-  Middle trapezius 5- 5-  Lower trapezius 5- 5-   Goal status: INITIAL  4.  Decrease reported pain to 4/10 Baseline: 7/10 Goal status:  INITIAL    PLAN:  PT FREQUENCY: 1-2x/week  PT DURATION: 4 weeks  PLANNED INTERVENTIONS: Therapeutic exercises, Therapeutic activity, Neuromuscular re-education, Balance training, Gait training, Patient/Family education, Self Care, Joint mobilization, Dry Needling, Electrical stimulation, Cryotherapy, Moist heat, Manual therapy, and Re-evaluation  PLAN FOR NEXT SESSION: HEP review and update, manual techniques as appropriate, aerobic tasks, ROM and flexibility activities, strengthening and PREs, TPDN, gait and balance training as needed     Berta Minor, PTA 08/12/2023, 4:53 PM

## 2023-08-21 ENCOUNTER — Ambulatory Visit: Payer: 59

## 2023-08-21 DIAGNOSIS — M25511 Pain in right shoulder: Secondary | ICD-10-CM

## 2023-08-21 DIAGNOSIS — M6281 Muscle weakness (generalized): Secondary | ICD-10-CM

## 2023-08-21 NOTE — Therapy (Signed)
OUTPATIENT PHYSICAL THERAPY TREATMENT  NOTE   Patient Name: Laytin Rubiano MRN: [id number] DOB:[birth year], 52 y.o., male Today's Date: 08/21/2023  END OF SESSION:  PT End of Session - 08/21/23 1742     Visit Number 3    Number of Visits 5    Date for PT Re-Evaluation 10/05/23    Authorization Type [employer]    PT Start Time 1745    PT Stop Time 1825    PT Time Calculation (min) 40 min    Activity Tolerance Patient tolerated treatment well    Behavior During Therapy [facility] for tasks assessed/performed             Past Medical History:  Diagnosis Date   GERD (gastroesophageal reflux disease)    HIV (human immunodeficiency virus infection) (HCC)    Past Surgical History:  Procedure Laterality Date   HERNIA REPAIR     childhood   Patient Active Problem List   Diagnosis Date Noted   At increased risk for cardiovascular disease 06/09/2023   Nocturia more than twice per night 01/09/2021   Healthcare maintenance 11/06/2018   Hepatitis B immune 01/22/2017   Skin nodule 09/04/2015   Contact dermatitis 09/15/2014   Hyperglycemia 08/30/2013   Erectile dysfunction 08/30/2013   Arthritis 02/12/2012   GERD 12/29/2009   Constipation 12/29/2009   VOMITING 12/29/2009   PROTEINURIA, MILD 04/01/2007   Human immunodeficiency virus (HIV) disease (HCC) 01/01/2007   DISORDER, TOBACCO USE 01/01/2007    PCP: Veryl Speak, FNP   REFERRING PROVIDER: Lenda Kelp, MD  REFERRING DIAG: M25.511,M25.512 (ICD-10-CM) - Acute pain of both shoulders  THERAPY DIAG:  Acute pain of both shoulders  Muscle weakness (generalized)  Rationale for Evaluation and Treatment: Rehabilitation  ONSET DATE: 1+ month  SUBJECTIVE:                                                                                                                                                                                      SUBJECTIVE STATEMENT: Patient reports that his Lt shoulder is hurting more today and  that he has been compliant with his HEP.   Hand dominance: Right  PERTINENT HISTORY: HPI: Patient is a 52 y.o. male here for Bilateral shoulder pain with the left being worse than the right.  Patient states that approximately a month ago he was hit in the back by his dog and fell on his left elbow.  Patient states that since then he has been dealing with some left shoulder pain that is relatively debilitating.  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  Patient also notes that he fell about a week ago on his right shoulder down the stairs but notes that the right shoulder is not nearly as bad as the left.  Patient states that he cannot lift his left arm above 80-ish degrees.  Patient states he also notes some crepitus whenever he is moving his arms around.  No other concerns at this time.  PAIN:  Are you having pain? Yes: NPRS scale: 7/10 Pain location: B shoulder Pain description: ache Aggravating factors: UE use Relieving factors: rest  PRECAUTIONS: None  RED FLAGS: None   WEIGHT BEARING RESTRICTIONS: No  FALLS:  Has patient fallen in last 6 months? Yes. Number of falls 1  OCCUPATION: Truck driver  PLOF: Independent  PATIENT GOALS:To regain the use of my shoulders  NEXT MD VISIT:   OBJECTIVE:   DIAGNOSTIC FINDINGS:  Impression:Normal Rotator Cuff Muscles and Tendons: No evidence of tears, tendinopathy, or other abnormalities. Subacromial bursitis, bicipital tenosynovitis.  PATIENT SURVEYS:  FOTO 52(72 predicted)  POSTURE: unremarkable  UPPER EXTREMITY ROM: WNL B  Active ROM Right eval Left eval  Shoulder flexion    Shoulder extension    Shoulder abduction    Shoulder adduction    Shoulder internal rotation    Shoulder external rotation    Elbow flexion    Elbow extension    Wrist flexion    Wrist extension    Wrist ulnar deviation    Wrist radial deviation     Wrist pronation    Wrist supination    (Blank rows = not tested)  UPPER EXTREMITY MMT:  MMT Right eval Left eval  Shoulder flexion 5- 5-  Shoulder extension 5- 5-  Shoulder abduction 5- 5-  Shoulder adduction    Shoulder internal rotation 5- 5-  Shoulder external rotation 5- 5-  Middle trapezius 5- 5-  Lower trapezius 5- 5-  Elbow flexion    Elbow extension    Wrist flexion    Wrist extension    Wrist ulnar deviation    Wrist radial deviation    Wrist pronation    Wrist supination    Grip strength (lbs)    (Blank rows = not tested)  SHOULDER SPECIAL TESTS: Impingement tests: Neer impingement test: negative and Hawkins/Kennedy impingement test: negative Rotator cuff assessment: Drop arm test: negative, Empty can test: negative, and Full can test: negative   PALPATION:  TTP B AC joints   TODAY'S TREATMENT:       [facility] Adult PT Treatment:                                                DATE: 08/21/23 Therapeutic Exercise: UBE level 2 3'/3' fwd/bwd  High/low rows 45# 2x10 Lat pull down 45# 2x10 Horizontal abduction back against wall GTB 2x10 Diagonals back against wall GTB x10 BIL Seated BIL ER with scap retraction GTB 2x10 (describes clicking in Lt shoulder) Supine serratus punch 2# 2x15 BIL Prone ITWYis x10 ea Sidelying trio Lt 2# ER, abduction, flexion 2x15 ea     [facility] Adult PT Treatment:  DATE: 08/12/23 Therapeutic Exercise: UBE level 2 3'/3' fwd/bwd  High/low rows 45# 2x10 Lat pull down 45# 2x10 Shoulder extension 2x10# cables 2x10 Horizontal abduction back against wall GTB 2x10 Diagonals back against wall GTB 2x10 BIL Seated BIL ER with scap retraction GTB 2x10 Supine serratus punch 2# 2x10 BIL Seated upper trap stretch 2x30" BIL Seated levator scap stretch 2x30" BIL                                                                                                                                   DATE: 08/05/23  Eval and HEP   PATIENT EDUCATION: Education details: Discussed eval findings, rehab rationale and POC and patient is in agreement  Person educated: Patient Education method: Explanation Education comprehension: verbalized understanding and needs further education  HOME EXERCISE PROGRAM: Access Code: [email or web address] URL: [email or web address] Date: 08/05/2023 Prepared by: Gustavus Bryant  Exercises - Shoulder External Rotation and Scapular Retraction with Resistance  - 2 x daily - 5 x weekly - 2 sets - 15 reps - Standing Shoulder Horizontal Abduction with Resistance  - 2 x daily - 5 x weekly - 2 sets - 15 reps - Standing Shoulder Scaption  - 2 x daily - 5 x weekly - 2 sets - 15 reps  ASSESSMENT:  CLINICAL IMPRESSION: Patient presents to PT reporting increased L shoulder pain today, particularly with motions into abduction. Session today continued to focus on RTC and periscapular strengthening, modifying for increased pain as needed. He describes a clicking feeling in his shoulder with resisted ER. Patient was able to tolerate all prescribed exercises with no adverse effects. Patient continues to benefit from skilled PT services and should be progressed as able to improve functional independence.    OBJECTIVE IMPAIRMENTS: decreased activity tolerance, decreased knowledge of condition, decreased ROM, decreased strength, impaired UE functional use, postural dysfunction, and pain.   ACTIVITY LIMITATIONS: carrying, lifting, and reach over head  PARTICIPATION LIMITATIONS:  washing face  PERSONAL FACTORS: Age, Fitness, and Time since onset of injury/illness/exacerbation are also affecting patient's functional outcome.   REHAB POTENTIAL: Good  CLINICAL DECISION MAKING: Evolving/moderate complexity  EVALUATION COMPLEXITY: Moderate   GOALS: Goals reviewed with patient? No  SHORT TERM GOALS=LONG TERM GOALS: Target date: 09/02/2023   Patient to demonstrate independence in HEP   Baseline: Access Code: [email or web address] Goal status: INITIAL  2.  Patient will score at least 72% on FOTO to signify clinically meaningful improvement in functional abilities.   Baseline: 52% Goal status: INITIAL   3.  Increase B shoulder strength to 5/5 Baseline:  MMT Right eval Left eval  Shoulder flexion 5- 5-  Shoulder extension 5- 5-  Shoulder abduction 5- 5-  Shoulder adduction    Shoulder internal rotation 5- 5-  Shoulder external rotation 5- 5-  Middle trapezius 5- 5-  Lower trapezius 5- 5-   Goal status: INITIAL  4.  Decrease reported pain to 4/10 Baseline: 7/10 Goal status:  INITIAL    PLAN:  PT FREQUENCY: 1-2x/week  PT DURATION: 4 weeks  PLANNED INTERVENTIONS: Therapeutic exercises, Therapeutic activity, Neuromuscular re-education, Balance training, Gait training, Patient/Family education, Self Care, Joint mobilization, Dry Needling, Electrical stimulation, Cryotherapy, Moist heat, Manual therapy, and Re-evaluation  PLAN FOR NEXT SESSION: HEP review and update, manual techniques as appropriate, aerobic tasks, ROM and flexibility activities, strengthening and PREs, TPDN, gait and balance training as needed     Berta Minor, PTA 08/21/2023, 6:28 PM

## 2023-08-23 NOTE — Therapy (Signed)
OUTPATIENT PHYSICAL THERAPY TREATMENT  NOTE   Patient Name: Elijah Guerra MRN: [id number] DOB:[birth year], 52 y.o., male Today's Date: 08/23/2023  END OF SESSION:    Past Medical History:  Diagnosis Date   GERD (gastroesophageal reflux disease)    HIV (human immunodeficiency virus infection) (HCC)    Past Surgical History:  Procedure Laterality Date   HERNIA REPAIR     childhood   Patient Active Problem List   Diagnosis Date Noted   At increased risk for cardiovascular disease 06/09/2023   Nocturia more than twice per night 01/09/2021   Healthcare maintenance 11/06/2018   Hepatitis B immune 01/22/2017   Skin nodule 09/04/2015   Contact dermatitis 09/15/2014   Hyperglycemia 08/30/2013   Erectile dysfunction 08/30/2013   Arthritis 02/12/2012   GERD 12/29/2009   Constipation 12/29/2009   VOMITING 12/29/2009   PROTEINURIA, MILD 04/01/2007   Human immunodeficiency virus (HIV) disease (HCC) 01/01/2007   DISORDER, TOBACCO USE 01/01/2007    PCP: Veryl Speak, FNP   REFERRING PROVIDER: Lenda Kelp, MD  REFERRING DIAG: M25.511,M25.512 (ICD-10-CM) - Acute pain of both shoulders  THERAPY DIAG:  No diagnosis found.  Rationale for Evaluation and Treatment: Rehabilitation  ONSET DATE: 1+ month  SUBJECTIVE:                                                                                                                                                                                      SUBJECTIVE STATEMENT: ***  Patient reports that his Lt shoulder is hurting more today and that he has been compliant with his HEP.   Hand dominance: Right  PERTINENT HISTORY: HPI: Patient is a 52 y.o. male here for Bilateral shoulder pain with the left being worse than the right.  Patient states that approximately a month ago he was hit in the back by his dog and fell on his left elbow.  Patient states that since then he has been dealing with some left shoulder pain that is  relatively debilitating.  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  Patient also notes that he fell about a week ago on his right shoulder down the stairs but notes that the right shoulder is not nearly as bad as the left.  Patient states that he cannot lift his left arm above 80-ish degrees.  Patient states he also notes some crepitus whenever he is moving his arms around.  No other concerns at this time.  PAIN:  Are you having pain? Yes: NPRS scale: 7/10 Pain location: B shoulder Pain description: ache Aggravating factors:  UE use Relieving factors: rest  PRECAUTIONS: None  RED FLAGS: None   WEIGHT BEARING RESTRICTIONS: No  FALLS:  Has patient fallen in last 6 months? Yes. Number of falls 1  OCCUPATION: Truck driver  PLOF: Independent  PATIENT GOALS:To regain the use of my shoulders  NEXT MD VISIT:   OBJECTIVE:   DIAGNOSTIC FINDINGS:  Impression:Normal Rotator Cuff Muscles and Tendons: No evidence of tears, tendinopathy, or other abnormalities. Subacromial bursitis, bicipital tenosynovitis.  PATIENT SURVEYS:  FOTO 52(72 predicted)  POSTURE: unremarkable  UPPER EXTREMITY ROM: WNL B  Active ROM Right eval Left eval  Shoulder flexion    Shoulder extension    Shoulder abduction    Shoulder adduction    Shoulder internal rotation    Shoulder external rotation    Elbow flexion    Elbow extension    Wrist flexion    Wrist extension    Wrist ulnar deviation    Wrist radial deviation    Wrist pronation    Wrist supination    (Blank rows = not tested)  UPPER EXTREMITY MMT:  MMT Right eval Left eval  Shoulder flexion 5- 5-  Shoulder extension 5- 5-  Shoulder abduction 5- 5-  Shoulder adduction    Shoulder internal rotation 5- 5-  Shoulder external rotation 5- 5-  Middle trapezius 5- 5-  Lower trapezius 5- 5-  Elbow flexion    Elbow extension    Wrist  flexion    Wrist extension    Wrist ulnar deviation    Wrist radial deviation    Wrist pronation    Wrist supination    Grip strength (lbs)    (Blank rows = not tested)  SHOULDER SPECIAL TESTS: Impingement tests: Neer impingement test: negative and Hawkins/Kennedy impingement test: negative Rotator cuff assessment: Drop arm test: negative, Empty can test: negative, and Full can test: negative   PALPATION:  TTP B AC joints   TODAY'S TREATMENT:      [facility] Adult PT Treatment:                                                DATE: 08/23/2023 *** Therapeutic Exercise: UBE level 2 3'/3' fwd/bwd  High/low rows 45# 2x10 Lat pull down 45# 2x10 Horizontal abduction back against wall GTB 2x10 Diagonals back against wall GTB x10 BIL Seated BIL ER with scap retraction GTB 2x10 (describes clicking in Lt shoulder) Supine serratus punch 2# 2x15 BIL Prone ITWYis x10 ea Sidelying trio Lt 2# ER, abduction, flexion 2x15 ea    [facility] Adult PT Treatment:                                                DATE: 08/21/23 Therapeutic Exercise: UBE level 2 3'/3' fwd/bwd  High/low rows 45# 2x10 Lat pull down 45# 2x10 Horizontal abduction back against wall GTB 2x10 Diagonals back against wall GTB x10 BIL Seated BIL ER with scap retraction GTB 2x10 (describes clicking in Lt shoulder) Supine serratus punch 2# 2x15 BIL Prone ITWYis x10 ea Sidelying trio Lt 2# ER, abduction, flexion 2x15 ea     [facility] Adult PT Treatment:  DATE: 08/12/23 Therapeutic Exercise: UBE level 2 3'/3' fwd/bwd  High/low rows 45# 2x10 Lat pull down 45# 2x10 Shoulder extension 2x10# cables 2x10 Horizontal abduction back against wall GTB 2x10 Diagonals back against wall GTB 2x10 BIL Seated BIL ER with scap retraction GTB 2x10 Supine serratus punch 2# 2x10 BIL Seated upper trap stretch 2x30" BIL Seated levator scap stretch 2x30" BIL                                                                                                                                    DATE: 08/05/23 Eval and HEP   PATIENT EDUCATION: Education details: Discussed eval findings, rehab rationale and POC and patient is in agreement  Person educated: Patient Education method: Explanation Education comprehension: verbalized understanding and needs further education  HOME EXERCISE PROGRAM: Access Code: [email or web address] URL: [email or web address] Date: 08/05/2023 Prepared by: Gustavus Bryant  Exercises - Shoulder External Rotation and Scapular Retraction with Resistance  - 2 x daily - 5 x weekly - 2 sets - 15 reps - Standing Shoulder Horizontal Abduction with Resistance  - 2 x daily - 5 x weekly - 2 sets - 15 reps - Standing Shoulder Scaption  - 2 x daily - 5 x weekly - 2 sets - 15 reps  ASSESSMENT:  CLINICAL IMPRESSION: ***  Patient presents to PT reporting increased L shoulder pain today, particularly with motions into abduction. Session today continued to focus on RTC and periscapular strengthening, modifying for increased pain as needed. He describes a clicking feeling in his shoulder with resisted ER. Patient was able to tolerate all prescribed exercises with no adverse effects. Patient continues to benefit from skilled PT services and should be progressed as able to improve functional independence.    OBJECTIVE IMPAIRMENTS: decreased activity tolerance, decreased knowledge of condition, decreased ROM, decreased strength, impaired UE functional use, postural dysfunction, and pain.   ACTIVITY LIMITATIONS: carrying, lifting, and reach over head  PARTICIPATION LIMITATIONS:  washing face  PERSONAL FACTORS: Age, Fitness, and Time since onset of injury/illness/exacerbation are also affecting patient's functional outcome.   REHAB POTENTIAL: Good  CLINICAL DECISION MAKING: Evolving/moderate complexity  EVALUATION COMPLEXITY: Moderate   GOALS: Goals reviewed with patient? No  SHORT TERM  GOALS=LONG TERM GOALS: Target date: 09/02/2023   Patient to demonstrate independence in HEP  Baseline: Access Code: [email or web address] Goal status: INITIAL  2.  Patient will score at least 72% on FOTO to signify clinically meaningful improvement in functional abilities.   Baseline: 52% Goal status: INITIAL   3.  Increase B shoulder strength to 5/5 Baseline:  MMT Right eval Left eval  Shoulder flexion 5- 5-  Shoulder extension 5- 5-  Shoulder abduction 5- 5-  Shoulder adduction    Shoulder internal rotation 5- 5-  Shoulder external rotation 5- 5-  Middle trapezius 5- 5-  Lower trapezius 5- 5-   Goal status: INITIAL  4.  Decrease reported pain to 4/10 Baseline: 7/10  Goal status: INITIAL    PLAN:  PT FREQUENCY: 1-2x/week  PT DURATION: 4 weeks  PLANNED INTERVENTIONS: Therapeutic exercises, Therapeutic activity, Neuromuscular re-education, Balance training, Gait training, Patient/Family education, Self Care, Joint mobilization, Dry Needling, Electrical stimulation, Cryotherapy, Moist heat, Manual therapy, and Re-evaluation  PLAN FOR NEXT SESSION: HEP review and update, manual techniques as appropriate, aerobic tasks, ROM and flexibility activities, strengthening and PREs, TPDN, gait and balance training as needed     Mauri Reading, PT, DPT  08/23/2023, 1:51 PM

## 2023-08-26 ENCOUNTER — Ambulatory Visit: Payer: PRIVATE HEALTH INSURANCE | Attending: Family Medicine

## 2023-08-26 DIAGNOSIS — M25512 Pain in left shoulder: Secondary | ICD-10-CM | POA: Insufficient documentation

## 2023-08-26 DIAGNOSIS — M12811 Other specific arthropathies, not elsewhere classified, right shoulder: Secondary | ICD-10-CM

## 2023-08-26 DIAGNOSIS — M25511 Pain in right shoulder: Secondary | ICD-10-CM | POA: Diagnosis present

## 2023-08-26 DIAGNOSIS — M12812 Other specific arthropathies, not elsewhere classified, left shoulder: Secondary | ICD-10-CM | POA: Diagnosis present

## 2023-08-26 DIAGNOSIS — M6281 Muscle weakness (generalized): Secondary | ICD-10-CM

## 2023-09-02 ENCOUNTER — Ambulatory Visit: Payer: PRIVATE HEALTH INSURANCE

## 2023-09-02 DIAGNOSIS — M12811 Other specific arthropathies, not elsewhere classified, right shoulder: Secondary | ICD-10-CM

## 2023-09-02 DIAGNOSIS — M25511 Pain in right shoulder: Secondary | ICD-10-CM | POA: Diagnosis not present

## 2023-09-02 DIAGNOSIS — M6281 Muscle weakness (generalized): Secondary | ICD-10-CM

## 2023-09-02 NOTE — Therapy (Signed)
OUTPATIENT PHYSICAL THERAPY TREATMENT NOTE   Patient Name: Elijah Guerra MRN: [id number] DOB:08/22/1971, 52 y.o., male Today's Date: 09/02/2023  END OF SESSION:     Past Medical History:  Diagnosis Date   GERD (gastroesophageal reflux disease)    HIV (human immunodeficiency virus infection) (HCC)    Past Surgical History:  Procedure Laterality Date   HERNIA REPAIR     childhood   Patient Active Problem List   Diagnosis Date Noted   At increased risk for cardiovascular disease 06/09/2023   Nocturia more than twice per night 01/09/2021   Healthcare maintenance 11/06/2018   Hepatitis B immune 01/22/2017   Skin nodule 09/04/2015   Contact dermatitis 09/15/2014   Hyperglycemia 08/30/2013   Erectile dysfunction 08/30/2013   Arthritis 02/12/2012   GERD 12/29/2009   Constipation 12/29/2009   VOMITING 12/29/2009   PROTEINURIA, MILD 04/01/2007   Human immunodeficiency virus (HIV) disease (HCC) 01/01/2007   DISORDER, TOBACCO USE 01/01/2007    PCP: Veryl Speak, FNP   REFERRING PROVIDER: Lenda Kelp, MD  REFERRING DIAG: M25.511,M25.512 (ICD-10-CM) - Acute pain of both shoulders  THERAPY DIAG:  No diagnosis found.  Rationale for Evaluation and Treatment: Rehabilitation  ONSET DATE: 1+ month  SUBJECTIVE:                                                                                                                                                                                      SUBJECTIVE STATEMENT: Patient reports that he's had some soreness over the weekend. He didn't have to drive for work since last visit. He states that he sat in the hot tub which helped.   Hand dominance: Right  PERTINENT HISTORY: HPI: Patient is a 52 y.o. male here for Bilateral shoulder pain with the left being worse than the right.  Patient states that approximately a month ago he was hit in the back by his dog and fell on his left elbow.  Patient states that since then he has  been dealing with some left shoulder pain that is relatively debilitating.  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  Patient also notes that he fell about a week ago on his right shoulder down the stairs but notes that the right shoulder is not nearly as bad as the left.  Patient states that he cannot lift his left arm above 80-ish degrees.  Patient states he also notes some crepitus whenever he is moving his arms around.  No other concerns at this time.  PAIN:  Are you having pain? Yes: NPRS scale:  7/10 Pain location: B shoulder Pain description: ache Aggravating factors: UE use Relieving factors: rest  PRECAUTIONS: None  RED FLAGS: None   WEIGHT BEARING RESTRICTIONS: No  FALLS:  Has patient fallen in last 6 months? Yes. Number of falls 1  OCCUPATION: Truck driver  PLOF: Independent  PATIENT GOALS:To regain the use of my shoulders  NEXT MD VISIT:   OBJECTIVE:   DIAGNOSTIC FINDINGS:  Impression:Normal Rotator Cuff Muscles and Tendons: No evidence of tears, tendinopathy, or other abnormalities. Subacromial bursitis, bicipital tenosynovitis.  PATIENT SURVEYS:  FOTO 52(72 predicted)  POSTURE: unremarkable  UPPER EXTREMITY ROM: WNL B  Active ROM Right eval Left eval  Shoulder flexion    Shoulder extension    Shoulder abduction    Shoulder adduction    Shoulder internal rotation    Shoulder external rotation    Elbow flexion    Elbow extension    Wrist flexion    Wrist extension    Wrist ulnar deviation    Wrist radial deviation    Wrist pronation    Wrist supination    (Blank rows = not tested)  UPPER EXTREMITY MMT:  MMT Right eval Left eval  Shoulder flexion 5- 5-  Shoulder extension 5- 5-  Shoulder abduction 5- 5-  Shoulder adduction    Shoulder internal rotation 5- 5-  Shoulder external rotation 5- 5-  Middle trapezius 5- 5-  Lower trapezius 5- 5-   Elbow flexion    Elbow extension    Wrist flexion    Wrist extension    Wrist ulnar deviation    Wrist radial deviation    Wrist pronation    Wrist supination    Grip strength (lbs)    (Blank rows = not tested)  SHOULDER SPECIAL TESTS: Impingement tests: Neer impingement test: negative and Hawkins/Kennedy impingement test: negative Rotator cuff assessment: Drop arm test: negative, Empty can test: negative, and Full can test: negative   PALPATION:  TTP B AC joints    TODAY'S TREATMENT:      [facility] Adult PT Treatment:                                                DATE: 09/02/2023  Therapeutic Exercise: UBE level 2 3'/3' fwd/bwd  Wall slides with red TB for resisted ER, 2 x 10  High/low rows 65# x 15 Lat pull down 65#, 2 x 15  Updated and reviewed HEP   Therapeutic Activity:  Reassessment of objective measures and subjective assessment regarding progress towards established goals and plan for independence with prescribed home program following discharged from PT   [facility] Adult PT Treatment:                                                DATE: 08/26/2023  Therapeutic Exercise: UBE level 2 3'/3' fwd/bwd  High/low rows 55# 2x10 Lat pull down 55# 2x10 Horizontal abduction back against wall GTB 2x15 Diagonals back against wall GTB x10 BIL Supine serratus punch 7# 2x15 BIL Sidelying trio Lt 3# abduction, flexion x 20 each Updated and reviewed HEP   Neuromuscular Reeducation:  Shoulder isometrics with varied resistance from clinician x 6 min total  Scaption, flexion, extension, abduction, adduction  PNF rhythmic stabilization  x 10 minutes  D1 flexion/extension    Horizontal adduction   [facility] Adult PT Treatment:                                                DATE: 08/21/23 Therapeutic Exercise: UBE level 2 3'/3' fwd/bwd  High/low rows 45# 2x10 Lat pull down 45# 2x10 Horizontal abduction back against wall GTB 2x10 Diagonals back against wall GTB x10 BIL Seated BIL ER with  scap retraction GTB 2x10 (describes clicking in Lt shoulder) Supine serratus punch 2# 2x15 BIL Prone ITWYis x10 ea Sidelying trio Lt 2# ER, abduction, flexion 2x15 ea     [facility] Adult PT Treatment:                                                DATE: 08/12/23 Therapeutic Exercise: UBE level 2 3'/3' fwd/bwd  High/low rows 45# 2x10 Lat pull down 45# 2x10 Shoulder extension 2x10# cables 2x10 Horizontal abduction back against wall GTB 2x10 Diagonals back against wall GTB 2x10 BIL Seated BIL ER with scap retraction GTB 2x10 Supine serratus punch 2# 2x10 BIL Seated upper trap stretch 2x30" BIL Seated levator scap stretch 2x30" BIL                                                                                                                                    PATIENT EDUCATION: Education details: Discussed eval findings, rehab rationale and POC and patient is in agreement  Person educated: Patient Education method: Explanation Education comprehension: verbalized understanding and needs further education  HOME EXERCISE PROGRAM: Access Code: [email or web address] URL: [email or web address] Date: 09/02/2023 Prepared by: Mauri Reading  Exercises - Shoulder External Rotation and Scapular Retraction with Resistance  - 1 x daily - 3 x weekly - 2 sets - 15 reps - Standing Shoulder Horizontal Abduction with Resistance  - 1 x daily - 3 x weekly - 2 sets - 15 reps - Standing Shoulder Scaption  - 1 x daily - 3 x weekly - 2 sets - 15 reps - Prone W Scapular Retraction  - 1 x daily - 3 x weekly - 2 sets - 15 reps - Prone Single Arm Shoulder Y  - 1 x daily - 3 x weekly - 2 sets - 15 reps - Shoulder Flexion Wall Slide with Resistance Band  - 1 x daily - 3 x weekly - 2 sets - 10 reps - Standing Shoulder Row with Anchored Resistance  - 1 x daily - 3 x weekly - 2 sets - 10 reps - Standing Lat Pull Down with Resistance - Elbows Bent  - 1 x daily - 3 x weekly - 2  sets - 10 reps - Shoulder extension with  resistance - Neutral  - 1 x daily - 3 x weekly - 2 sets - 10 reps  ASSESSMENT:  CLINICAL IMPRESSION: ***  Blayze is currently demonstrating pain and difficulty with horizontal adduction. He continues to have audible clicking with overhead motions and requires increased therapeutic exercises and interventions to address shoulder stabilization. He responded well to manual isometric and PNF activity today, with improved pain-free ROM at end of session. He will continue to benefit from progression of shoulder stabilization activities. Plan is to reassess current progress and extend POC if indicated.    OBJECTIVE IMPAIRMENTS: decreased activity tolerance, decreased knowledge of condition, decreased ROM, decreased strength, impaired UE functional use, postural dysfunction, and pain.   ACTIVITY LIMITATIONS: carrying, lifting, and reach over head  PARTICIPATION LIMITATIONS: washing face  PERSONAL FACTORS: Age, Fitness, and Time since onset of injury/illness/exacerbation are also affecting patient's functional outcome.   REHAB POTENTIAL: Good  CLINICAL DECISION MAKING: Evolving/moderate complexity  EVALUATION COMPLEXITY: Moderate   GOALS: Goals reviewed with patient? No  SHORT TERM GOALS=LONG TERM GOALS: Target date: 09/02/2023   Patient to demonstrate independence in HEP  Baseline: Access Code: [email or web address] Goal status: MET  2.  Patient will score at least 72% on FOTO to signify clinically meaningful improvement in functional abilities.   Baseline: 52% 09/02/23:  Goal status: INITIAL   3.  Increase B shoulder strength to 5/5 Baseline:  MMT Right eval Left eval  Shoulder flexion 5- 5-  Shoulder extension 5- 5-  Shoulder abduction 5- 5-  Shoulder adduction    Shoulder internal rotation 5- 5-  Shoulder external rotation 5- 5-  Middle trapezius 5- 5-  Lower trapezius 5- 5-  09/02/23: no change in MMT since eval Goal status: NOT MET  4.  Decrease reported pain to 4/10 Baseline:  7/10 09/02/23: 2-5/10  Goal status: MET    PLAN:  PT FREQUENCY: 1-2x/week  PT DURATION: 4 weeks  PLANNED INTERVENTIONS: Therapeutic exercises, Therapeutic activity, Neuromuscular re-education, Balance training, Gait training, Patient/Family education, Self Care, Joint mobilization, Dry Needling, Electrical stimulation, Cryotherapy, Moist heat, Manual therapy, and Re-evaluation  PLAN FOR NEXT SESSION: HEP review and update, manual techniques as appropriate, aerobic tasks, ROM and flexibility activities, strengthening and PREs, TPDN, gait and balance training as needed     Mauri Reading, PT, DPT  09/02/2023, 5:05 PM

## 2023-09-23 ENCOUNTER — Other Ambulatory Visit: Payer: Self-pay | Admitting: Infectious Diseases

## 2023-09-23 DIAGNOSIS — K5900 Constipation, unspecified: Secondary | ICD-10-CM

## 2023-09-23 NOTE — Telephone Encounter (Addendum)
I called pt - no answer. I called both telephone#'s on pt's chart; either no vm or # not in service. I will send refill request to [facility].

## 2023-09-24 ENCOUNTER — Other Ambulatory Visit: Payer: Self-pay | Admitting: Family Medicine

## 2023-09-25 ENCOUNTER — Other Ambulatory Visit: Payer: Self-pay | Admitting: *Deleted

## 2023-12-01 ENCOUNTER — Other Ambulatory Visit: Payer: 59

## 2023-12-15 ENCOUNTER — Other Ambulatory Visit: Payer: Self-pay

## 2023-12-15 ENCOUNTER — Ambulatory Visit (INDEPENDENT_AMBULATORY_CARE_PROVIDER_SITE_OTHER): Payer: 59 | Admitting: Family

## 2023-12-15 ENCOUNTER — Encounter: Payer: Self-pay | Admitting: Family

## 2023-12-15 VITALS — BP 159/92 | HR 85 | Temp 97.9°F | Ht 65.0 in | Wt 199.0 lb

## 2023-12-15 DIAGNOSIS — K5900 Constipation, unspecified: Secondary | ICD-10-CM

## 2023-12-15 DIAGNOSIS — L309 Dermatitis, unspecified: Secondary | ICD-10-CM | POA: Insufficient documentation

## 2023-12-15 DIAGNOSIS — R030 Elevated blood-pressure reading, without diagnosis of hypertension: Secondary | ICD-10-CM | POA: Diagnosis not present

## 2023-12-15 DIAGNOSIS — B20 Human immunodeficiency virus [HIV] disease: Secondary | ICD-10-CM | POA: Diagnosis not present

## 2023-12-15 DIAGNOSIS — Z0000 Encounter for general adult medical examination without abnormal findings: Secondary | ICD-10-CM

## 2023-12-15 MED ORDER — HYDROCORTISONE 2.5 % EX CREA: TOPICAL_CREAM | Freq: Two times a day (BID) | CUTANEOUS | 0 refills | Status: AC

## 2023-12-15 MED ORDER — LINACLOTIDE 145 MCG PO CAPS: 145.0000 ug | ORAL_CAPSULE | Freq: Every day | ORAL | 5 refills | Status: DC

## 2023-12-15 MED ORDER — SYMTUZA 800-150-200-10 MG PO TABS: 1.0000 | ORAL_TABLET | Freq: Every day | ORAL | 7 refills | Status: DC

## 2023-12-15 MED ORDER — FLUOCINONIDE EMULSIFIED BASE 0.05 % EX CREA: 1.0000 | TOPICAL_CREAM | Freq: Two times a day (BID) | CUTANEOUS | 1 refills | Status: AC

## 2023-12-15 NOTE — Assessment & Plan Note (Signed)
Elijah Guerra continues to have episodes of eczema that is controlled with current dose of hydrocortisone and fluocinonide creams.  Discussed possible referral to dermatology if he continues to have symptoms.  Encouraged moisturization with Marice Potter, Aveeno, or Eucerin products.  Continue to monitor.

## 2023-12-15 NOTE — Assessment & Plan Note (Signed)
Mr. Holzknecht has been on Trulance previously which is no longer covered by insurance and continues to experience constipation associated with irritable bowel syndrome as he has had cramping in the past.  Discontinue Trulance.  Start Linzess.  Titrate per response.

## 2023-12-15 NOTE — Assessment & Plan Note (Signed)
Discussed importance of safe sexual practice and condom use. Condoms and site specific STD testing offered.  Vaccinations reviewed - declined today.  Colonoscopy for colon cancer screening up to date.

## 2023-12-15 NOTE — Progress Notes (Signed)
Brief Narrative   Patient ID: Elijah Guerra, male    DOB: June 10, 1971, 53 y.o.   MRN: [id number]  Elijah Guerra is a 53 y/o AA male diagnosed with HIV disease in 2007 with risk factor of MSM. Initial viral load was 21,811 and CD4 count 192. Entered care at [facility] Stage 3. Genotype with K101E (I - Rilpivirine and Neviripine). No history of opportunistic infection. ART experienced with Atripla, Genvoya, Cabenuva (virologic failure), and Symtuza.   Subjective:    Chief Complaint  Patient presents with   Follow-up    B20    HPI:  Elijah Guerra is a 53 y.o. male with HIV disease last seen on 06/09/2023 with well-controlled virus and good adherence and tolerance to Symtuza.  Viral load was undetectable with CD4 count 541.  Renal function, liver function, electrolytes within normal ranges.  Lipid profile triglycerides 218, LDL 108, and HDL 44.  Here today for routine follow-up.  Elijah Guerra has been doing well since his last office visit with no new concerns/complaints.  Requesting refills of medication for his eczema as well as irritable bowel syndrome.  Continues to have flares of eczema that he uses hydrocortisone 2.5% and fluocinonide cream to help control.  Has new insurance and no longer able to get Trulance and will need to switch to Linzess.  Continues to take Symtuza as prescribed with no adverse side effects or problems obtaining medication from the pharmacy.  Condoms and site-specific STD testing offered.  Healthcare maintenance reviewed and due for routine dental care.  Denies fevers, chills, night sweats, headaches, changes in vision, neck pain/stiffness, nausea, diarrhea, vomiting, lesions or rashes.  Lab Results  Component Value Date   CD4TCELL 34 05/22/2023   CD4TABS 541 05/22/2023   Lab Results  Component Value Date   HIV1RNAQUANT 23 (H) 05/22/2023     No Known Allergies    Outpatient Medications Prior to Visit  Medication Sig Dispense Refill    Darunavir-Cobicistat-Emtricitabine-Tenofovir Alafenamide (SYMTUZA) 800-150-200-10 MG TABS Take 1 tablet by mouth daily with breakfast. 30 tablet 7   hydrocortisone (ANUSOL-HC) 2.5 % rectal cream Place 1 Application rectally 2 (two) times daily. (Patient not taking: Reported on 12/15/2023) 30 g 0   meloxicam (MOBIC) 15 MG tablet Take 1 tablet (15 mg total) by mouth daily. (Patient not taking: Reported on 12/15/2023) 30 tablet 2   TRULANCE 3 MG TABS TAKE 1 TABLET BY MOUTH EVERY DAY FOR 21 DAYS AS DIRECTED (Patient not taking: Reported on 12/15/2023) 30 tablet 4   No facility-administered medications prior to visit.     Past Medical History:  Diagnosis Date   GERD (gastroesophageal reflux disease)    HIV (human immunodeficiency virus infection) (HCC)      Past Surgical History:  Procedure Laterality Date   HERNIA REPAIR     childhood      Review of Systems  Constitutional:  Negative for appetite change, chills, fatigue, fever and unexpected weight change.  Eyes:  Negative for visual disturbance.  Respiratory:  Negative for cough, chest tightness, shortness of breath and wheezing.   Cardiovascular:  Negative for chest pain and leg swelling.  Gastrointestinal:  Negative for abdominal pain, constipation, diarrhea, nausea and vomiting.  Genitourinary:  Negative for dysuria, flank pain, frequency, genital sores, hematuria and urgency.  Skin:  Positive for rash.  Allergic/Immunologic: Negative for immunocompromised state.  Neurological:  Negative for dizziness and headaches.      Objective:    BP (!) 159/92   Pulse 85  Temp 97.9 F (36.6 C) (Temporal)   Ht 5\' 5"  (1.651 m)   Wt 199 lb (90.3 kg)   SpO2 97%   BMI 33.12 kg/m  Nursing note and vital signs reviewed.  Physical Exam Constitutional:      General: He is not in acute distress.    Appearance: He is well-developed.  Eyes:     Conjunctiva/sclera: Conjunctivae normal.  Cardiovascular:     Rate and Rhythm: Normal rate  and regular rhythm.     Heart sounds: Normal heart sounds. No murmur heard.    No friction rub. No gallop.  Pulmonary:     Effort: Pulmonary effort is normal. No respiratory distress.     Breath sounds: Normal breath sounds. No wheezing or rales.  Chest:     Chest wall: No tenderness.  Abdominal:     General: Bowel sounds are normal.     Palpations: Abdomen is soft.     Tenderness: There is no abdominal tenderness.  Musculoskeletal:     Cervical back: Neck supple.  Lymphadenopathy:     Cervical: No cervical adenopathy.  Skin:    General: Skin is warm and dry.     Findings: No rash.  Neurological:     Mental Status: He is alert and oriented to person, place, and time.  Psychiatric:        Behavior: Behavior normal.        Thought Content: Thought content normal.        Judgment: Judgment normal.         12/15/2023    4:03 PM 06/09/2023    3:14 PM 05/02/2022    1:59 PM 08/29/2020    4:31 PM 08/03/2020    4:21 PM  Depression screen PHQ 2/9  Decreased Interest 0 0 0 0 0  Down, Depressed, Hopeless 0 0 0 0 0  PHQ - 2 Score 0 0 0 0 0       Assessment & Plan:    Patient Active Problem List   Diagnosis Date Noted   Eczema 12/15/2023   Elevated blood pressure reading 12/15/2023   At increased risk for cardiovascular disease 06/09/2023   Nocturia more than twice per night 01/09/2021   Healthcare maintenance 11/06/2018   Hepatitis B immune 01/22/2017   Skin nodule 09/04/2015   Contact dermatitis 09/15/2014   Hyperglycemia 08/30/2013   Erectile dysfunction 08/30/2013   Arthritis 02/12/2012   GERD 12/29/2009   Constipation 12/29/2009   VOMITING 12/29/2009   PROTEINURIA, MILD 04/01/2007   Human immunodeficiency virus (HIV) disease (HCC) 01/01/2007   DISORDER, TOBACCO USE 01/01/2007     Problem List Items Addressed This Visit       Musculoskeletal and Integument   Eczema   Elijah Guerra continues to have episodes of eczema that is controlled with current dose of  hydrocortisone and fluocinonide creams.  Discussed possible referral to dermatology if he continues to have symptoms.  Encouraged moisturization with Marice Potter, Aveeno, or Eucerin products.  Continue to monitor.        Other   Human immunodeficiency virus (HIV) disease (HCC)   Elijah Guerra continues to have well-controlled virus with good adherence and tolerance to Symtuza.  Reviewed previous blood work and discussed plan of care and U equals U.  Check blood work.  Continue current dose of Symtuza.  Discussed any new/upcoming medications that may be injectable.  Plan for follow-up in 6 months or sooner if needed with lab work 1 to 2 weeks prior to appointment.  Relevant Medications   Darunavir-Cobicistat-Emtricitabine-Tenofovir Alafenamide (SYMTUZA) 800-150-200-10 MG TABS   Other Relevant Orders   COMPLETE METABOLIC PANEL WITH GFR   HIV-1 RNA quant-no reflex-bld   T-helper cell (CD4)- ([facility] clinic only)   Constipation - Primary   Mr. Urdaneta has been on Trulance previously which is no longer covered by insurance and continues to experience constipation associated with irritable bowel syndrome as he has had cramping in the past.  Discontinue Trulance.  Start Linzess.  Titrate per response.      Healthcare maintenance   Discussed importance of safe sexual practice and condom use. Condoms and site specific STD testing offered.  Vaccinations reviewed - declined today.  Colonoscopy for colon cancer screening up to date.       Elevated blood pressure reading   Mr. Berhow's blood pressure is elevated above 140/90 although was not resting for a significant period of time.  Encourage lifestyle management and to monitor blood pressure at home.  Blood pressure remains elevated may need to start medication.  No current neurologic or ophthalmologic signs/symptoms.        I have discontinued Tereso Pestka's hydrocortisone, meloxicam, and Trulance. I am also having him start on hydrocortisone,  fluocinonide-emollient, and linaclotide. Additionally, I am having him maintain his Symtuza.   Meds ordered this encounter  Medications   hydrocortisone 2.5 % cream    Sig: Apply topically 2 (two) times daily.    Dispense:  30 g    Refill:  0    Supervising Provider:   Judyann Munson [[id number]]   fluocinonide-emollient (LIDEX-E) 0.05 % cream    Sig: Apply 1 Application topically 2 (two) times daily.    Dispense:  60 g    Refill:  1    Supervising Provider:   Judyann Munson [[id number]]   linaclotide (LINZESS) 145 MCG CAPS capsule    Sig: Take 1 capsule (145 mcg total) by mouth daily before breakfast.    Dispense:  30 capsule    Refill:  5    Supervising Provider:   Judyann Munson [[id number]]   Darunavir-Cobicistat-Emtricitabine-Tenofovir Alafenamide (SYMTUZA) 800-150-200-10 MG TABS    Sig: Take 1 tablet by mouth daily with breakfast.    Dispense:  30 tablet    Refill:  7    Supervising Provider:   Judyann Munson [phone]    Prescription Type::   Renewal     Follow-up: Return in about 6 months (around 06/13/2024), or if symptoms worsen or fail to improve. or sooner if needed.    Marcos Eke, MSN, FNP-C Nurse Practitioner [facility] for Infectious Disease [facility] Main number: [phone]

## 2023-12-15 NOTE — Patient Instructions (Addendum)
Nice to see you.  We will check your lab work today.  Continue to take your medication daily as prescribed.  Refills have been sent to the pharmacy.  Please call [facility] ([facility]) to schedule/follow up on your dental care at [phone]  Plan for follow up in 6 months or sooner if needed with lab work 1-2 weeks prior to appointment.   Have a great day and stay safe!  

## 2023-12-15 NOTE — Assessment & Plan Note (Signed)
Elijah Guerra blood pressure is elevated above 140/90 although was not resting for a significant period of time.  Encourage lifestyle management and to monitor blood pressure at home.  Blood pressure remains elevated may need to start medication.  No current neurologic or ophthalmologic signs/symptoms.

## 2023-12-15 NOTE — Assessment & Plan Note (Signed)
Mr. Altamirano continues to have well-controlled virus with good adherence and tolerance to Symtuza.  Reviewed previous blood work and discussed plan of care and U equals U.  Check blood work.  Continue current dose of Symtuza.  Discussed any new/upcoming medications that may be injectable.  Plan for follow-up in 6 months or sooner if needed with lab work 1 to 2 weeks prior to appointment.

## 2023-12-16 LAB — T-HELPER CELL (CD4) - (RCID CLINIC ONLY)
CD4 % Helper T Cell: 35 % (ref 33–65)
CD4 T Cell Abs: 733 /uL (ref 400–1790)

## 2023-12-17 LAB — COMPLETE METABOLIC PANEL WITH GFR
AG Ratio: 1.7 (calc) (ref 1.0–2.5)
ALT: 37 U/L (ref 9–46)
AST: 25 U/L (ref 10–35)
Albumin: 4.5 g/dL (ref 3.6–5.1)
Alkaline phosphatase (APISO): 72 U/L (ref 35–144)
BUN/Creatinine Ratio: 11 (calc) (ref 6–22)
BUN: 16 mg/dL (ref 7–25)
CO2: 31 mmol/L (ref 20–32)
Calcium: 9.4 mg/dL (ref 8.6–10.3)
Chloride: 105 mmol/L (ref 98–110)
Creat: 1.41 mg/dL — ABNORMAL HIGH (ref 0.70–1.30)
Globulin: 2.6 g/dL (ref 1.9–3.7)
Glucose, Bld: 83 mg/dL (ref 65–99)
Potassium: 3.9 mmol/L (ref 3.5–5.3)
Sodium: 143 mmol/L (ref 135–146)
Total Bilirubin: 0.7 mg/dL (ref 0.2–1.2)
Total Protein: 7.1 g/dL (ref 6.1–8.1)
eGFR: 60 mL/min/{1.73_m2} (ref >=60)

## 2023-12-17 LAB — HIV-1 RNA QUANT-NO REFLEX-BLD
HIV 1 RNA Quant: NOT DETECTED {copies}/mL
HIV-1 RNA Quant, Log: NOT DETECTED {Log}

## 2024-04-08 NOTE — Progress Notes (Signed)
 The 10-year ASCVD risk score (Arnett DK, et al., 2019) is: 15.2%   Values used to calculate the score:     Age: 53 years     Sex: Male     Is Non-Hispanic African American: Yes     Diabetic: No     Tobacco smoker: Yes     Systolic Blood Pressure: 159 mmHg     Is BP treated: No     HDL Cholesterol: 44 mg/dL     Total Cholesterol: 186 mg/dL  No current statin therapy, next appointment note updated.   Anorah Trias, BSN, RN

## 2024-07-09 ENCOUNTER — Other Ambulatory Visit: Payer: Self-pay

## 2024-07-09 DIAGNOSIS — Z79899 Other long term (current) drug therapy: Secondary | ICD-10-CM

## 2024-07-09 DIAGNOSIS — Z113 Encounter for screening for infections with a predominantly sexual mode of transmission: Secondary | ICD-10-CM

## 2024-07-09 DIAGNOSIS — B20 Human immunodeficiency virus [HIV] disease: Secondary | ICD-10-CM

## 2024-07-13 ENCOUNTER — Other Ambulatory Visit: Payer: Self-pay

## 2024-07-13 ENCOUNTER — Other Ambulatory Visit: Payer: 59

## 2024-07-13 DIAGNOSIS — Z113 Encounter for screening for infections with a predominantly sexual mode of transmission: Secondary | ICD-10-CM

## 2024-07-13 DIAGNOSIS — Z79899 Other long term (current) drug therapy: Secondary | ICD-10-CM

## 2024-07-13 DIAGNOSIS — B20 Human immunodeficiency virus [HIV] disease: Secondary | ICD-10-CM

## 2024-07-14 LAB — T-HELPER CELL (CD4) - (RCID CLINIC ONLY)
CD4 % Helper T Cell: 32 % — ABNORMAL LOW (ref 33–65)
CD4 T Cell Abs: 743 /uL (ref 400–1790)

## 2024-07-15 LAB — CBC WITH DIFFERENTIAL/PLATELET
Absolute Lymphocytes: 2376 {cells}/uL (ref 850–3900)
Absolute Monocytes: 876 {cells}/uL (ref 200–950)
Basophils Absolute: 30 {cells}/uL (ref 0–200)
Basophils Relative: 0.5 %
Eosinophils Absolute: 228 {cells}/uL (ref 15–500)
Eosinophils Relative: 3.8 %
HCT: 45.9 % (ref 38.5–50.0)
Hemoglobin: 14.9 g/dL (ref 13.2–17.1)
MCH: 33.3 pg — ABNORMAL HIGH (ref 27.0–33.0)
MCHC: 32.5 g/dL (ref 32.0–36.0)
MCV: 102.5 fL — ABNORMAL HIGH (ref 80.0–100.0)
MPV: 10.9 fL (ref 7.5–12.5)
Monocytes Relative: 14.6 %
Neutro Abs: 2490 {cells}/uL (ref 1500–7800)
Neutrophils Relative %: 41.5 %
Platelets: 192 Thousand/uL (ref 140–400)
RBC: 4.48 Million/uL (ref 4.20–5.80)
RDW: 11.4 % (ref 11.0–15.0)
Total Lymphocyte: 39.6 %
WBC: 6 Thousand/uL (ref 3.8–10.8)

## 2024-07-15 LAB — COMPLETE METABOLIC PANEL WITHOUT GFR
AG Ratio: 1.7 (calc) (ref 1.0–2.5)
ALT: 40 U/L (ref 9–46)
AST: 28 U/L (ref 10–35)
Albumin: 4.6 g/dL (ref 3.6–5.1)
Alkaline phosphatase (APISO): 78 U/L (ref 35–144)
BUN: 15 mg/dL (ref 7–25)
CO2: 29 mmol/L (ref 20–32)
Calcium: 9.7 mg/dL (ref 8.6–10.3)
Chloride: 107 mmol/L (ref 98–110)
Creat: 1.29 mg/dL (ref 0.70–1.30)
Globulin: 2.7 g/dL (ref 1.9–3.7)
Glucose, Bld: 66 mg/dL (ref 65–99)
Potassium: 3.9 mmol/L (ref 3.5–5.3)
Sodium: 142 mmol/L (ref 135–146)
Total Bilirubin: 0.8 mg/dL (ref 0.2–1.2)
Total Protein: 7.3 g/dL (ref 6.1–8.1)

## 2024-07-15 LAB — LIPID PANEL
Cholesterol: 187 mg/dL (ref <200)
HDL: 43 mg/dL (ref >=40)
LDL Cholesterol (Calc): 116 mg/dL — ABNORMAL HIGH
Non-HDL Cholesterol (Calc): 144 mg/dL — ABNORMAL HIGH (ref <130)
Total CHOL/HDL Ratio: 4.3 (calc) (ref <5.0)
Triglycerides: 161 mg/dL — ABNORMAL HIGH (ref <150)

## 2024-07-15 LAB — RPR: RPR Ser Ql: NONREACTIVE

## 2024-07-15 LAB — HIV-1 RNA QUANT-NO REFLEX-BLD
HIV 1 RNA Quant: NOT DETECTED {copies}/mL
HIV-1 RNA Quant, Log: NOT DETECTED {Log_copies}/mL

## 2024-07-29 ENCOUNTER — Ambulatory Visit: Payer: Self-pay | Admitting: Family

## 2024-08-03 ENCOUNTER — Other Ambulatory Visit: Payer: Self-pay

## 2024-08-03 ENCOUNTER — Encounter: Payer: Self-pay | Admitting: Family

## 2024-08-03 ENCOUNTER — Ambulatory Visit: Admitting: Family

## 2024-08-03 VITALS — BP 148/93 | HR 88 | Temp 97.9°F | Wt 192.0 lb

## 2024-08-03 DIAGNOSIS — B20 Human immunodeficiency virus [HIV] disease: Secondary | ICD-10-CM | POA: Diagnosis not present

## 2024-08-03 DIAGNOSIS — K5900 Constipation, unspecified: Secondary | ICD-10-CM | POA: Diagnosis not present

## 2024-08-03 DIAGNOSIS — Z9189 Other specified personal risk factors, not elsewhere classified: Secondary | ICD-10-CM

## 2024-08-03 DIAGNOSIS — Z0000 Encounter for general adult medical examination without abnormal findings: Secondary | ICD-10-CM

## 2024-08-03 MED ORDER — LINACLOTIDE 72 MCG PO CAPS: 72.0000 ug | ORAL_CAPSULE | Freq: Every day | ORAL | 0 refills | Status: DC

## 2024-08-03 MED ORDER — SYMTUZA 800-150-200-10 MG PO TABS: 1.0000 | ORAL_TABLET | Freq: Every day | ORAL | 7 refills | Status: AC

## 2024-08-03 NOTE — Patient Instructions (Addendum)
 Nice to see you.  Continue to take your medication daily as prescribed.  Refills have been sent to the pharmacy.  Plan for follow up in 6 months or sooner if needed with lab work 1-2 weeks prior to appointment.   Have a great day and stay safe!

## 2024-08-03 NOTE — Assessment & Plan Note (Signed)
 Discussed importance of safe sexual practice and condom use. Condoms and site specific STD testing offered.  Vaccinations reviewed and deferred following counseling. Due for routine dental care which he will schedule independently. Colon cancer screening up-to-date per recommendations.

## 2024-08-03 NOTE — Assessment & Plan Note (Signed)
 Currently on Linzess  which has helped with his constipation having a bowel movement within an hour after taking medication although does leave him with diarrhea.  Decrease Linzess  to 72 mcg daily to see if this helps with symptoms.

## 2024-08-03 NOTE — Assessment & Plan Note (Signed)
 Elijah Guerra continues to have well-controlled virus with good adherence and tolerance to Symtuza .  Reviewed lab work and discussed plan of care and U equals U.  Social determinants of health reviewed with no interventions indicated.  Covered by [employer].  Continue current dose of Symtuza .  Plan for follow-up in 6 months or sooner if needed with lab work 1 to 2 weeks prior to appointment.

## 2024-08-03 NOTE — Progress Notes (Signed)
 Brief Narrative   Patient ID: Elijah Guerra, male    DOB: 06-Mar-1971, 53 y.o.   MRN: [id number]  Elijah Guerra is a 53 y/o AA male diagnosed with HIV disease in 2007 with risk factor of MSM. Initial viral load was 21,811 and CD4 count 192. Entered care at [facility] Stage 3. Genotype with K101E (I - Rilpivirine  and Neviripine). No history of opportunistic infection. ART experienced with Atripla, Genvoya , Cabenuva  (virologic failure), and Symtuza .   Subjective:   Chief Complaint  Patient presents with   Follow-up    HPI:  Elijah Guerra is a 53 y.o. male with HIV disease last seen on 12/15/23 with well-controlled virus and good adherence and tolerance to Symtuza .  Most recent lab work completed on 07/13/2024 with viral load undetectable and CD4 count 743.  Kidney function, liver function, electrolytes within normal ranges.  Lipid profile with LDL 116, triglycerides 161, and HDL 43.  Here today for routine follow-up.  Elijah Guerra has been doing okay since his last office visit and continues to take Symtuza  as prescribed with no adverse side effects or problems obtaining medication from the pharmacy.  Covered by [employer].  Has also been taking Linzess  to help with chronic constipation and has concern for worsening hemorrhoids recently.  When taking Linzess  has noted more diarrhea and loose stools.  Usually goes to the bathroom within 1 hour of taking the medication.  Housing, transportation, and access to food are stable.  Due for routine dental care.  Healthcare maintenance reviewed.  Currently smoking approximately 2 cigars/day.  Condoms and site-specific STD testing offered.  Denies fevers, chills, night sweats, headaches, changes in vision, neck pain/stiffness, nausea, diarrhea, vomiting, lesions or rashes.  Lab Results  Component Value Date   CD4TCELL 32 (L) 07/13/2024   CD4TABS 743 07/13/2024   Lab Results  Component Value Date   HIV1RNAQUANT NOT DETECTED 07/13/2024     No Known  Allergies    Outpatient Medications Prior to Visit  Medication Sig Dispense Refill   fluocinonide -emollient (LIDEX -E) 0.05 % cream Apply 1 Application topically 2 (two) times daily. 60 g 1   hydrocortisone  2.5 % cream Apply topically 2 (two) times daily. 30 g 0   Darunavir-Cobicistat-Emtricitabine-Tenofovir  Alafenamide (SYMTUZA ) 800-150-200-10 MG TABS Take 1 tablet by mouth daily with breakfast. 30 tablet 7   linaclotide  (LINZESS ) 145 MCG CAPS capsule Take 1 capsule (145 mcg total) by mouth daily before breakfast. 30 capsule 5   No facility-administered medications prior to visit.     Past Medical History:  Diagnosis Date   GERD (gastroesophageal reflux disease)    HIV (human immunodeficiency virus infection) (HCC)      Past Surgical History:  Procedure Laterality Date   HERNIA REPAIR     childhood        Review of Systems  Constitutional:  Negative for appetite change, chills, fatigue, fever and unexpected weight change.  Eyes:  Negative for visual disturbance.  Respiratory:  Negative for cough, chest tightness, shortness of breath and wheezing.   Cardiovascular:  Negative for chest pain and leg swelling.  Gastrointestinal:  Negative for abdominal pain, constipation, diarrhea, nausea and vomiting.  Genitourinary:  Negative for dysuria, flank pain, frequency, genital sores, hematuria and urgency.  Skin:  Negative for rash.  Allergic/Immunologic: Negative for immunocompromised state.  Neurological:  Negative for dizziness and headaches.     Objective:   BP (!) 148/93   Pulse 88   Temp 97.9 F (36.6 C) (Oral)   Wt 192  lb (87.1 kg)   SpO2 95%   BMI 31.95 kg/m  Nursing note and vital signs reviewed.  Physical Exam Constitutional:      General: He is not in acute distress.    Appearance: He is well-developed.  Eyes:     Conjunctiva/sclera: Conjunctivae normal.  Cardiovascular:     Rate and Rhythm: Normal rate and regular rhythm.     Heart sounds: Normal  heart sounds. No murmur heard.    No friction rub. No gallop.  Pulmonary:     Effort: Pulmonary effort is normal. No respiratory distress.     Breath sounds: Normal breath sounds. No wheezing or rales.  Chest:     Chest wall: No tenderness.  Abdominal:     General: Bowel sounds are normal.     Palpations: Abdomen is soft.     Tenderness: There is no abdominal tenderness.  Musculoskeletal:     Cervical back: Neck supple.  Lymphadenopathy:     Cervical: No cervical adenopathy.  Skin:    General: Skin is warm and dry.     Findings: No rash.  Neurological:     Mental Status: He is alert and oriented to person, place, and time.  Psychiatric:        Mood and Affect: Mood normal.          08/03/2024    2:31 PM 12/15/2023    4:03 PM 06/09/2023    3:14 PM 05/02/2022    1:59 PM 08/29/2020    4:31 PM  Depression screen PHQ 2/9  Decreased Interest 0 0 0 0 0  Down, Depressed, Hopeless 0 0 0 0 0  PHQ - 2 Score 0 0 0 0 0  Altered sleeping 0      Tired, decreased energy 0      Change in appetite 0      Feeling bad or failure about yourself  0      Trouble concentrating 0      Moving slowly or fidgety/restless 0      Suicidal thoughts 0      PHQ-9 Score 0      Difficult doing work/chores Not difficult at all            08/03/2024    2:31 PM 10/05/2018    4:18 PM  GAD 7 : Generalized Anxiety Score  Nervous, Anxious, on Edge 0 2  Control/stop worrying 0 1  Worry too much - different things 0 1  Trouble relaxing 0 0  Restless 0 0  Easily annoyed or irritable 0 0  Afraid - awful might happen 0 0  Total GAD 7 Score 0 4  Anxiety Difficulty Not difficult at all      The 10-year ASCVD risk score (Arnett DK, et al., 2019) is: 13.6%   Values used to calculate the score:     Age: 75 years     Clincally relevant sex: Male     Is Non-Hispanic African American: Yes     Diabetic: No     Tobacco smoker: Yes     Systolic Blood Pressure: 148 mmHg     Is BP treated: No     HDL  Cholesterol: 43 mg/dL     Total Cholesterol: 187 mg/dL      Assessment & Plan:    Patient Active Problem List   Diagnosis Date Noted   Eczema 12/15/2023   Elevated blood pressure reading 12/15/2023   At increased risk for cardiovascular disease 06/09/2023  Nocturia more than twice per night 01/09/2021   Healthcare maintenance 11/06/2018   Hepatitis B immune 01/22/2017   Skin nodule 09/04/2015   Contact dermatitis 09/15/2014   Hyperglycemia 08/30/2013   Erectile dysfunction 08/30/2013   Arthritis 02/12/2012   GERD 12/29/2009   Constipation 12/29/2009   VOMITING 12/29/2009   PROTEINURIA, MILD 04/01/2007   Human immunodeficiency virus (HIV) disease (HCC) 01/01/2007   DISORDER, TOBACCO USE 01/01/2007     Problem List Items Addressed This Visit       Other   Human immunodeficiency virus (HIV) disease (HCC)   Elijah Guerra continues to have well-controlled virus with good adherence and tolerance to Symtuza .  Reviewed lab work and discussed plan of care and U equals U.  Social determinants of health reviewed with no interventions indicated.  Covered by [employer].  Continue current dose of Symtuza .  Plan for follow-up in 6 months or sooner if needed with lab work 1 to 2 weeks prior to appointment.      Relevant Medications   Darunavir-Cobicistat-Emtricitabine-Tenofovir  Alafenamide (SYMTUZA ) 800-150-200-10 MG TABS   Constipation - Primary   Currently on Linzess  which has helped with his constipation having a bowel movement within an hour after taking medication although does leave him with diarrhea.  Decrease Linzess  to 72 mcg daily to see if this helps with symptoms.      Healthcare maintenance   Discussed importance of safe sexual practice and condom use. Condoms and site specific STD testing offered.  Vaccinations reviewed and deferred following counseling. Due for routine dental care which he will schedule independently. Colon cancer screening up-to-date per recommendations.       At increased risk for cardiovascular disease   Discussed increased risk for cardiovascular disease with 10-year ASCVD score of 13.6%.  Reviewed the benefits of statin medication to reduce risk of cardiovascular disease and HIV associated inflammation.  Would like to think about starting medication and not ready to start at this time.        I have changed Elijah Guerra's linaclotide . I am also having him maintain his hydrocortisone , fluocinonide -emollient, and Symtuza .   Meds ordered this encounter  Medications   Darunavir-Cobicistat-Emtricitabine-Tenofovir  Alafenamide (SYMTUZA ) 800-150-200-10 MG TABS    Sig: Take 1 tablet by mouth daily with breakfast.    Dispense:  30 tablet    Refill:  7    Supervising Provider:   LUIZ CHANNEL [phone]    Prescription Type::   Renewal   linaclotide  (LINZESS ) 72 MCG capsule    Sig: Take 1 capsule (72 mcg total) by mouth daily before breakfast.    Dispense:  30 capsule    Refill:  0    Supervising Provider:   LUIZ CHANNEL [[id number]]     Follow-up: Return in about 6 months (around 01/31/2025). or sooner if needed.    Cathlyn July, MSN, FNP-C Nurse Practitioner [facility] for Infectious Disease [facility] Main number: [phone]

## 2024-08-03 NOTE — Assessment & Plan Note (Signed)
 Discussed increased risk for cardiovascular disease with 10-year ASCVD score of 13.6%.  Reviewed the benefits of statin medication to reduce risk of cardiovascular disease and HIV associated inflammation.  Would like to think about starting medication and not ready to start at this time.

## 2024-10-26 ENCOUNTER — Other Ambulatory Visit: Payer: Self-pay | Admitting: Family
